# Patient Record
Sex: Male | Born: 1950 | Race: White | Hispanic: No | Marital: Married | Smoking: Never smoker
Health system: Southern US, Community
[De-identification: ages and names within clinical notes are randomized; demographics above are authoritative.]

## PROBLEM LIST (undated history)

## (undated) DIAGNOSIS — I10 Essential (primary) hypertension: Secondary | ICD-10-CM

## (undated) DIAGNOSIS — E119 Type 2 diabetes mellitus without complications: Secondary | ICD-10-CM

## (undated) DIAGNOSIS — E785 Hyperlipidemia, unspecified: Secondary | ICD-10-CM

## (undated) DIAGNOSIS — M6530 Trigger finger, unspecified finger: Secondary | ICD-10-CM

## (undated) DIAGNOSIS — K5090 Crohn's disease, unspecified, without complications: Secondary | ICD-10-CM

## (undated) HISTORY — DX: Essential (primary) hypertension: I10

## (undated) HISTORY — DX: Type 2 diabetes mellitus without complications: E11.9

## (undated) HISTORY — PX: KNEE ARTHROSCOPY: SUR90

---

## 2000-08-16 ENCOUNTER — Encounter: Payer: Self-pay | Admitting: Gastroenterology

## 2000-08-16 ENCOUNTER — Encounter: Admission: RE | Admit: 2000-08-16 | Discharge: 2000-08-16 | Payer: Self-pay | Admitting: Gastroenterology

## 2000-08-29 ENCOUNTER — Encounter (INDEPENDENT_AMBULATORY_CARE_PROVIDER_SITE_OTHER): Payer: Self-pay | Admitting: Specialist

## 2000-08-29 ENCOUNTER — Ambulatory Visit (HOSPITAL_COMMUNITY): Admission: RE | Admit: 2000-08-29 | Discharge: 2000-08-29 | Payer: Self-pay | Admitting: Gastroenterology

## 2004-04-15 ENCOUNTER — Encounter: Admission: RE | Admit: 2004-04-15 | Discharge: 2004-04-15 | Payer: Self-pay | Admitting: Emergency Medicine

## 2004-10-19 ENCOUNTER — Ambulatory Visit (HOSPITAL_COMMUNITY): Admission: RE | Admit: 2004-10-19 | Discharge: 2004-10-19 | Payer: Self-pay | Admitting: Orthopedic Surgery

## 2004-10-19 ENCOUNTER — Ambulatory Visit (HOSPITAL_BASED_OUTPATIENT_CLINIC_OR_DEPARTMENT_OTHER): Admission: RE | Admit: 2004-10-19 | Discharge: 2004-10-19 | Payer: Self-pay | Admitting: Orthopedic Surgery

## 2008-05-21 ENCOUNTER — Ambulatory Visit: Payer: Self-pay | Admitting: Diagnostic Radiology

## 2008-05-21 ENCOUNTER — Emergency Department (HOSPITAL_BASED_OUTPATIENT_CLINIC_OR_DEPARTMENT_OTHER): Admission: EM | Admit: 2008-05-21 | Discharge: 2008-05-21 | Payer: Self-pay | Admitting: Emergency Medicine

## 2009-05-01 HISTORY — PX: TRIGGER FINGER RELEASE: SHX641

## 2010-09-16 NOTE — Op Note (Signed)
NAMEISHMAIL, MCMANAMON                ACCOUNT NO.:  [id number]   MEDICAL RECORD NO.:  [id number]          PATIENT TYPE:  AMB   LOCATION:  DSC                          FACILITY:  [facility]   PHYSICIAN:  Cindee Salt, M.D.       DATE OF BIRTH:  12/12/1950   DATE OF PROCEDURE:  10/19/2004  DATE OF DISCHARGE:                                 OPERATIVE REPORT   PREOPERATIVE DIAGNOSIS:  Stenosing tenosynovitis, right index finger.   POSTOPERATIVE DIAGNOSIS:  Same.   OPERATION:  Release A1 pulley, right index finger.   SURGEON:  Cindee Salt, M.D.   ASSISTANTCarolyne Fiscal.   ANESTHESIA:  MAC.   HISTORY:  The patient is a 60 year old male with a history of triggering of  his right index finger. This has not responded to conservative treatment.   PROCEDURE:  The patient is brought to the operating room where a local  anesthetic was given with sedation after being prepped using DuraPrep,  supine position, right arm free. After adequate anesthesia was afforded, his  limb was exsanguinated. A tourniquet placed on forearm was inflated to 270  mmHg. An oblique incision was made, carried down through subcutaneous  tissue. Neurovascular structures identified and protected. The dissection  was then carried to the radial aspect of the A1 pulley.  Significant  tenosynovitis was present. This was removed. The A1 pulley was incised in  its radial aspect.  A small incision was made in A2 centrally. This allowed  the finger to be placed through full range motion with no further  triggering.  The wound was irrigated. Skin was closed with interrupted 5-0  nylon sutures. Sterile compressive dressing was applied. The patient  tolerated the procedure well and was taken to the recovery observation in  satisfactory condition. He is discharged home to return to the [facility]  [facility] [place] in one week on Percocet.       GK/MEDQ  D:  10/19/2004  T:  10/19/2004  Job:  [id number]

## 2010-09-16 NOTE — Procedures (Signed)
[facility]  Patient:    Shane Snyder, Shane Snyder                       MRN: [id number] Proc. Date: 08/29/00 Adm. Date:  [id number] Attending:  Orland Mustard CC:         Oley Balm. Georgina Pillion, M.D.   Procedure Report  PROCEDURE:  Colonoscopy and biopsy.  MEDICATIONS:  Fentanyl 75 mcg, Versed 10 mg IV.  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  The patient has never had any follow-up.  For this reason, colonoscopy is performed.  DESCRIPTION OF PROCEDURE:  The procedure had been explained to the patient and consent obtained.  With the patient in the left lateral decubitus position, digital exam is performed.  The adult Olympus video colonoscope was inserted and advanced under direct visualization.  The prep was excellent.  We were able to advance around to the cecum without difficulty.  The ileocecal valve was seen.  The terminal ileum was entered and was completely normal.  There was no evidence of Crohns.  The scope was withdrawn.  The cecum, ascending colon, hepatic flexure, and transverse colon were endoscopically normal. Several biopsies were taken.  The mucosa was carefully examined in the descending and sigmoid colon.  There was some granularity and friability of very mild nature.  This was biopsied and placed in jar #2.  The rectum appeared endoscopically normal.  The scope was withdrawn.  The patient tolerated the procedure well.  ASSESSMENT:  Possible mild left-sided colitis, clearly currently in remission.  PLAN:  Will see check pathology and continue on the same medicines for now. DD:  08/29/00 TD:  08/29/00 Job: [id number]

## 2012-08-19 ENCOUNTER — Telehealth: Payer: Self-pay | Admitting: Radiology

## 2012-08-19 ENCOUNTER — Ambulatory Visit (INDEPENDENT_AMBULATORY_CARE_PROVIDER_SITE_OTHER): Payer: BC Managed Care – PPO | Admitting: Family Medicine

## 2012-08-19 ENCOUNTER — Ambulatory Visit
Admission: RE | Admit: 2012-08-19 | Discharge: 2012-08-19 | Disposition: A | Discharge status: Home / Self Care | Payer: BC Managed Care – PPO | Source: Ambulatory Visit | Attending: Physician Assistant | Admitting: Physician Assistant

## 2012-08-19 VITALS — BP 142/87 | HR 63 | Temp 98.1°F | Resp 16 | Ht 69.75 in | Wt 221.0 lb

## 2012-08-19 DIAGNOSIS — L255 Unspecified contact dermatitis due to plants, except food: Secondary | ICD-10-CM

## 2012-08-19 DIAGNOSIS — M25421 Effusion, right elbow: Secondary | ICD-10-CM

## 2012-08-19 DIAGNOSIS — M25429 Effusion, unspecified elbow: Secondary | ICD-10-CM

## 2012-08-19 DIAGNOSIS — L237 Allergic contact dermatitis due to plants, except food: Secondary | ICD-10-CM

## 2012-08-19 MED ORDER — CLOBETASOL PROPIONATE 0.05 % EX CREA: TOPICAL_CREAM | Freq: Two times a day (BID) | CUTANEOUS | Status: DC

## 2012-08-19 NOTE — Telephone Encounter (Signed)
Call report from [facility],no DVT on [place], patient has been d/c home. Shane Snyder

## 2012-08-19 NOTE — Progress Notes (Signed)
  Subjective:    Patient ID: Shane Snyder, male    DOB: May 23, 1950, 62 y.o.   MRN: [id number]  HPI 62 year old male presents for evaluation of swelling of right arm.  Has had poison ivy on both arms and trunk x 1 week that he has been treating with calamine lotion.  States he would not have come in for the poison ivy, but the swelling this a.m alarmed him.  States in less than an hour, his right medial forearm swelled.  Denies pain, weakness, or numbness.  Does admit to some tingling in his fingers. No warmth or pain to palpation.  Poison ivy is on both arms and weeping.    PMHx of HTN and DM - controlled by PCP.     Review of Systems  Constitutional: Negative for fever and chills.  Respiratory: Negative for shortness of breath.   Skin: Positive for rash.  Neurological: Negative for headaches.       Objective:   Physical Exam  Constitutional: He is oriented to person, place, and time. He appears well-developed and well-nourished.  HENT:  Head: Normocephalic and atraumatic.  Right Ear: External ear normal.  Left Ear: External ear normal.  Eyes: Conjunctivae are normal.  Neck: Normal range of motion.  Cardiovascular: Normal rate.   Pulmonary/Chest: Effort normal.  Neurological: He is alert and oriented to person, place, and time.  Skin:     Psychiatric: He has a normal mood and affect. His behavior is normal. Judgment and thought content normal.          Assessment & Plan:  1. Swelling of joint of upper arm, right - Plan: US Venous Img Upper Uni Right  -Sent to [facility] for [place] of right upper extremity   -Rule out thrombus  -Discussed with patient RTC precautions including acute pain in right arm or increase in swelling 2. Poison ivy - Plan: clobetasol cream (TEMOVATE) 0.05 %  -Swelling likely due to poison ivy dermatitis.   -Unable to treat with prednisone due to DM type 2  -Clobetasol twice daily until resolved  -Zyrtec daily in the morning, Benadryl at  bedtime.

## 2012-08-19 NOTE — Progress Notes (Signed)
Examined pt with Shane Moody, PA-C.  He has soft swelling on the ulnar side of the right forearm. Normal cap refill of fingers, strong radial pulse.  Do not suspect compartment syndrome.    Called pt and let him know [place] was negative.  Again went over warning signs such as severe pain in his arm/ hand or numbness- call if any concerns or if not getting better soon.

## 2012-08-20 NOTE — Telephone Encounter (Signed)
Patient has been notified. Continue with treatment as planned and follow up if symptoms worsen or fail to improve.

## 2013-11-18 ENCOUNTER — Ambulatory Visit (INDEPENDENT_AMBULATORY_CARE_PROVIDER_SITE_OTHER): Payer: BLUE CROSS/BLUE SHIELD | Admitting: Family Medicine

## 2013-11-18 ENCOUNTER — Encounter: Payer: Self-pay | Admitting: Family Medicine

## 2013-11-18 VITALS — BP 130/80 | HR 68 | Temp 98.6°F | Resp 16 | Ht 71.0 in | Wt 224.4 lb

## 2013-11-18 DIAGNOSIS — H00039 Abscess of eyelid unspecified eye, unspecified eyelid: Secondary | ICD-10-CM

## 2013-11-18 DIAGNOSIS — H00033 Abscess of eyelid right eye, unspecified eyelid: Secondary | ICD-10-CM

## 2013-11-18 MED ORDER — CEPHALEXIN 500 MG PO CAPS: 500.0000 mg | ORAL_CAPSULE | Freq: Two times a day (BID) | ORAL | Status: DC

## 2013-11-18 MED ORDER — PREDNISONE 20 MG PO TABS: 40.0000 mg | ORAL_TABLET | Freq: Every day | ORAL | Status: DC

## 2013-11-18 NOTE — Patient Instructions (Signed)
Cellulitis Cellulitis is an infection of the skin and the tissue beneath it. The infected area is usually red and tender. Cellulitis occurs most often in the arms and lower legs.  CAUSES  Cellulitis is caused by bacteria that enter the skin through cracks or cuts in the skin. The most common types of bacteria that cause cellulitis are Staphylococcus and Streptococcus. SYMPTOMS   Redness and warmth.  Swelling.  Tenderness or pain.  Fever. DIAGNOSIS  Your caregiver can usually determine what is wrong based on a physical exam. Blood tests may also be done. TREATMENT  Treatment usually involves taking an antibiotic medicine. HOME CARE INSTRUCTIONS   Take your antibiotics as directed. Finish them even if you start to feel better.  Keep the infected arm or leg elevated to reduce swelling.  Apply a warm cloth to the affected area up to 4 times per day to relieve pain.  Only take over-the-counter or prescription medicines for pain, discomfort, or fever as directed by your caregiver.  Keep all follow-up appointments as directed by your caregiver. SEEK MEDICAL CARE IF:   You notice red streaks coming from the infected area.  Your red area gets larger or turns dark in color.  Your bone or joint underneath the infected area becomes painful after the skin has healed.  Your infection returns in the same area or another area.  You notice a swollen bump in the infected area.  You develop new symptoms. SEEK IMMEDIATE MEDICAL CARE IF:   You have a fever.  You feel very sleepy.  You develop vomiting or diarrhea.  You have a general ill feeling (malaise) with muscle aches and pains. MAKE SURE YOU:   Understand these instructions.  Will watch your condition.  Will get help right away if you are not doing well or get worse. Document Released: 01/25/2005 Document Revised: 10/17/2011 Document Reviewed: 07/03/2011 ExitCare Patient Information 2015 [employer]. This information is  not intended to replace advice given to you by your health care provider. Make sure you discuss any questions you have with your health care provider.  

## 2013-11-18 NOTE — Progress Notes (Signed)
This chart was scribed for Shane Snyder by [employer], Scribe. This patient was seen in room 10 and the patient's care was started at 8:19 AM.  @UMFCLOGO @  Patient ID: Shane Snyder MRN: [id number], DOB: 08-Jul-1950, 63 y.o. Date of Encounter: 11/18/2013, 8:25 AM  Primary Physician: No PCP Per Patient  Chief Complaint: Eye Issue  HPI: 63 y.o. year old male with history below presents with right eye swelling and redness noticed when he awoke from sleep this morning. He states that he noticed his eye to be slightly puffy last night, but that he was otherwise normal. He states that he has mild discomfort to the area, but he denies any burning, itching or pain to the eye. He denies any injury to his eye. He states that he has no medication allergies. He states that he works at [employer]. He has a history of HTN and DM which he states is well-controlled.   Past Medical History  Diagnosis Date  . Diabetes mellitus without complication   . Hypertension      Home Meds: Prior to Admission medications   Medication Sig Start Date End Date Taking? Authorizing Provider  clobetasol cream (TEMOVATE) 0.05 % Apply topically 2 (two) times daily. 08/19/12   Heather Elnora Morrison, PA-C  METFORMIN HCL PO Take 1 tablet by mouth daily.    Historical Provider, MD    Allergies: No Known Allergies  History   Social History  . Marital Status: Married    Spouse Name: N/A    Number of Children: N/A  . Years of Education: N/A   Occupational History  . Not on file.   Social History Main Topics  . Smoking status: Never Smoker   . Smokeless tobacco: Not on file  . Alcohol Use: Not on file  . Drug Use: No  . Sexual Activity: Yes   Other Topics Concern  . Not on file   Social History Narrative  . No narrative on file      Review of Systems: Constitutional: negative for chills, fever, night sweats, weight changes, or fatigue  HEENT: negative for vision changes, hearing loss, congestion,  rhinorrhea, ST, epistaxis, or sinus pressure Cardiovascular: negative for chest pain or palpitations Respiratory: negative for hemoptysis, wheezing, shortness of breath, or cough Abdominal: negative for abdominal pain, nausea, vomiting, diarrhea, or constipation Dermatological: negative for rash Neurologic: negative for headache, dizziness, or syncope All other systems reviewed and are otherwise negative with the exception to those above and in the HPI.   Physical Exam: Blood pressure 130/80, pulse 68, temperature 98.6 F (37 C), temperature source Oral, resp. rate 16, height 5\' 11"  (1.803 m), weight 224 lb 6.4 oz (101.787 kg), SpO2 96.00%., Body mass index is 31.31 kg/(m^2). General: Well developed, well nourished, in no acute distress. Head: Normocephalic, atraumatic, eyes without discharge, sclera non-icteric, Right upper eyelid is moderately swollen. PERRLA. EOM intact. nares are without discharge. Bilateral auditory canals clear, TM's are without perforation, pearly grey and translucent with reflective cone of light bilaterally. Oral cavity moist, posterior pharynx without exudate, erythema, peritonsillar abscess, or post nasal drip.  Neck: Supple. No thyromegaly. Full ROM. No lymphadenopathy. Lungs: Clear bilaterally to auscultation without wheezes, rales, or rhonchi. Breathing is unlabored. Heart: RRR with S1 S2. No murmurs, rubs, or gallops appreciated. Abdomen: Soft, non-tender, non-distended with normoactive bowel sounds. No hepatomegaly. No rebound/guarding. No obvious abdominal masses. Msk:  Strength and tone normal for age. Extremities/Skin: Warm and dry. No clubbing or cyanosis. No edema. No  rashes or suspicious lesions. Neuro: Alert and oriented X 3. Moves all extremities spontaneously. Gait is normal. CNII-XII grossly in tact. Psych:  Responds to questions appropriately with a normal affect.     ASSESSMENT AND PLAN:  63 y.o. year old male with Eyelid cellulitis, right -  Plan: predniSONE (DELTASONE) 20 MG tablet, cephALEXin (KEFLEX) 500 MG capsule   Signed, Shane Haber, MD 11/18/2013 8:25 AM

## 2017-10-31 ENCOUNTER — Other Ambulatory Visit: Payer: Self-pay | Admitting: Family Medicine

## 2017-10-31 DIAGNOSIS — N23 Unspecified renal colic: Secondary | ICD-10-CM

## 2017-11-12 ENCOUNTER — Ambulatory Visit
Admission: RE | Admit: 2017-11-12 | Discharge: 2017-11-12 | Disposition: A | Discharge status: Home / Self Care | Payer: Medicare Other | Source: Ambulatory Visit | Attending: Family Medicine | Admitting: Family Medicine

## 2017-11-12 DIAGNOSIS — N23 Unspecified renal colic: Secondary | ICD-10-CM

## 2017-11-16 ENCOUNTER — Other Ambulatory Visit: Payer: Self-pay | Admitting: Family Medicine

## 2017-11-16 DIAGNOSIS — K7689 Other specified diseases of liver: Secondary | ICD-10-CM

## 2017-12-04 ENCOUNTER — Ambulatory Visit
Admission: RE | Admit: 2017-12-04 | Discharge: 2017-12-04 | Disposition: A | Discharge status: Home / Self Care | Payer: Medicare Other | Source: Ambulatory Visit | Attending: Family Medicine | Admitting: Family Medicine

## 2017-12-04 DIAGNOSIS — K7689 Other specified diseases of liver: Secondary | ICD-10-CM

## 2018-06-28 ENCOUNTER — Other Ambulatory Visit: Payer: Self-pay | Admitting: Family Medicine

## 2018-06-28 DIAGNOSIS — K7689 Other specified diseases of liver: Secondary | ICD-10-CM

## 2018-07-10 ENCOUNTER — Ambulatory Visit
Admission: RE | Admit: 2018-07-10 | Discharge: 2018-07-10 | Disposition: A | Discharge status: Home / Self Care | Payer: Medicare Other | Source: Ambulatory Visit | Attending: Family Medicine | Admitting: Family Medicine

## 2018-07-10 DIAGNOSIS — K7689 Other specified diseases of liver: Secondary | ICD-10-CM

## 2018-10-05 IMAGING — US US ABDOMEN LIMITED
1 series · 14 of 25 positions shown · non-contrast
Comparison: 11/12/2017 CT

CLINICAL DATA: Follow-up liver cysts. Recent CT of the abdomen and
pelvis

EXAM:
ULTRASOUND ABDOMEN LIMITED RIGHT UPPER QUADRANT

[Series 1: us abdomen limited · 0.27mm/px · 69 acquisitions, 14 frames shown]
[im 1/69]
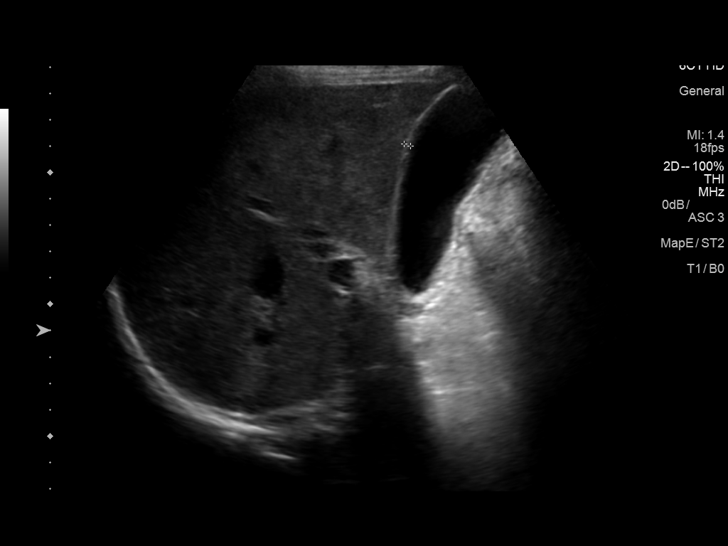
[im 6/69]
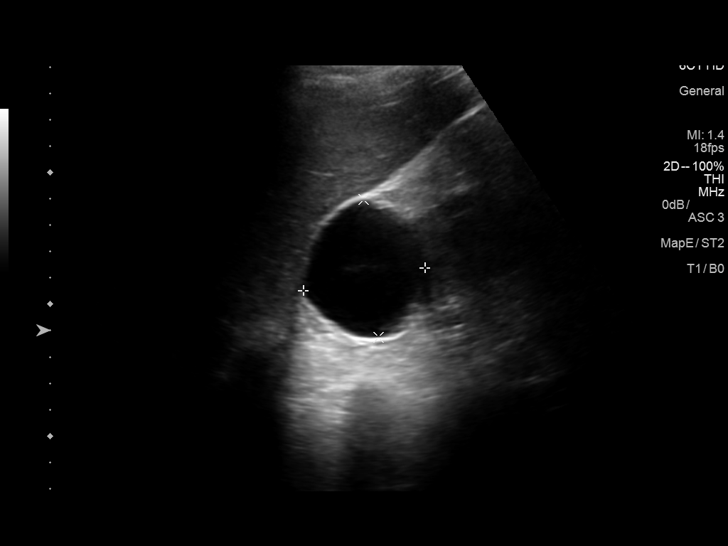
[im 12/69]
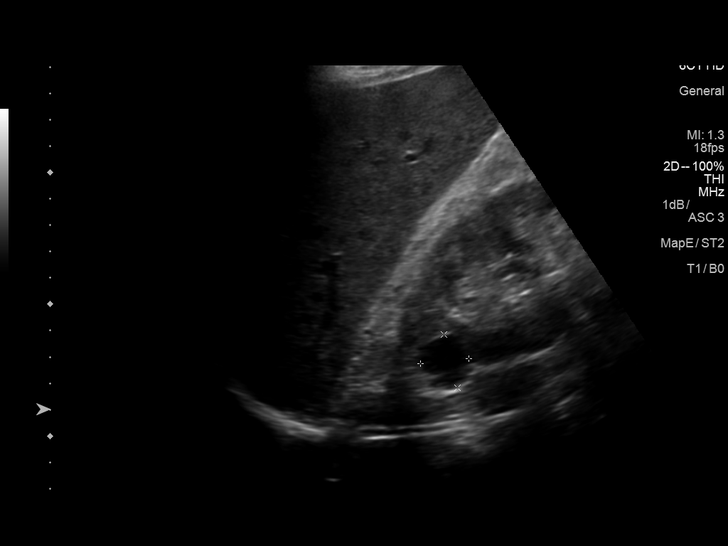
[im 18/69]
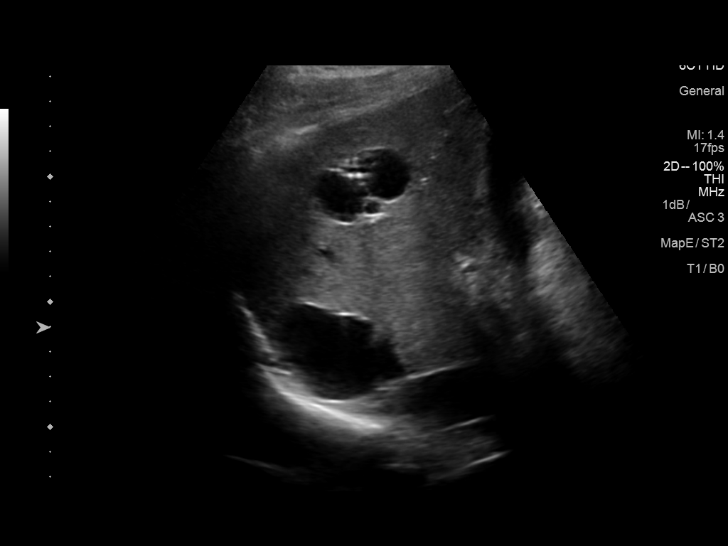
[im 23/69]
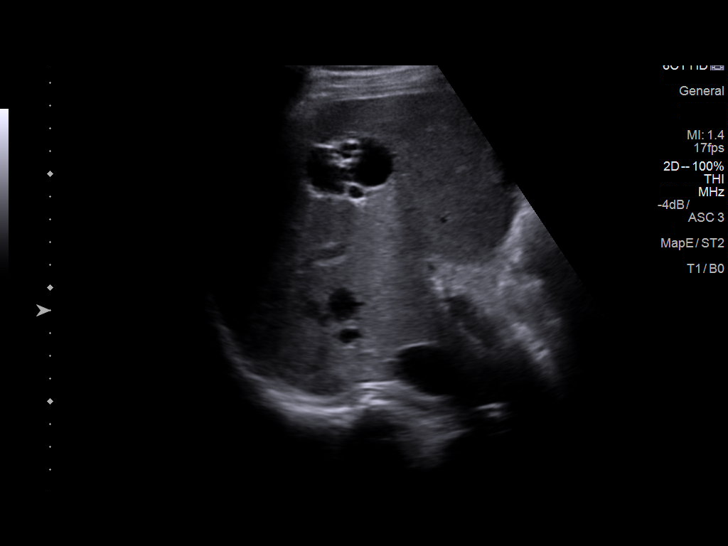
[im 26/69]
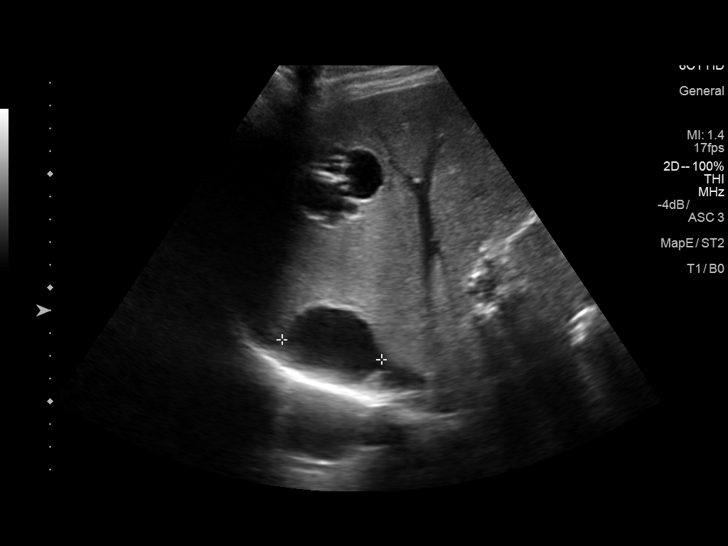
[im 32/69]
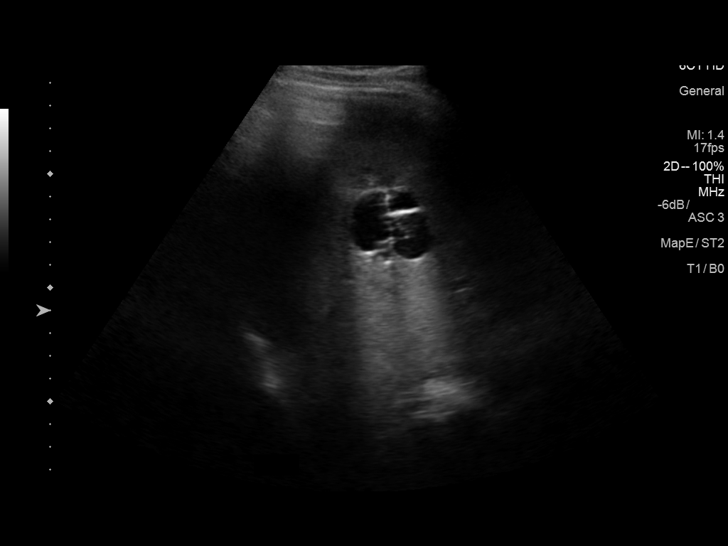
[im 37/69]
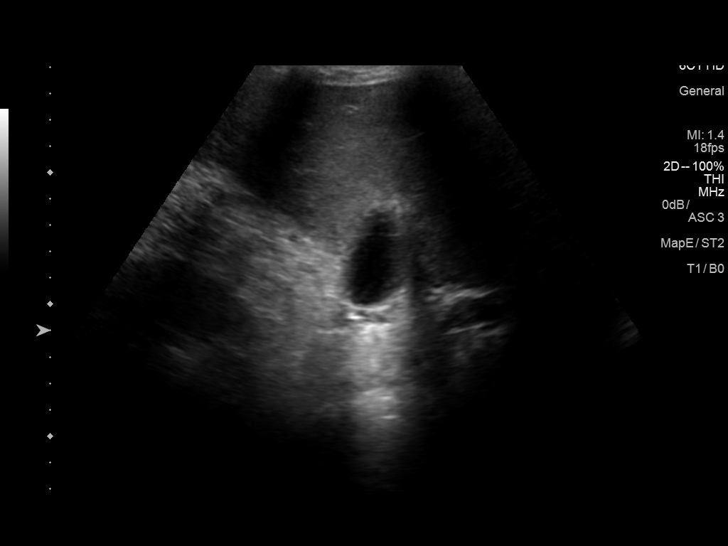
[im 43/69]
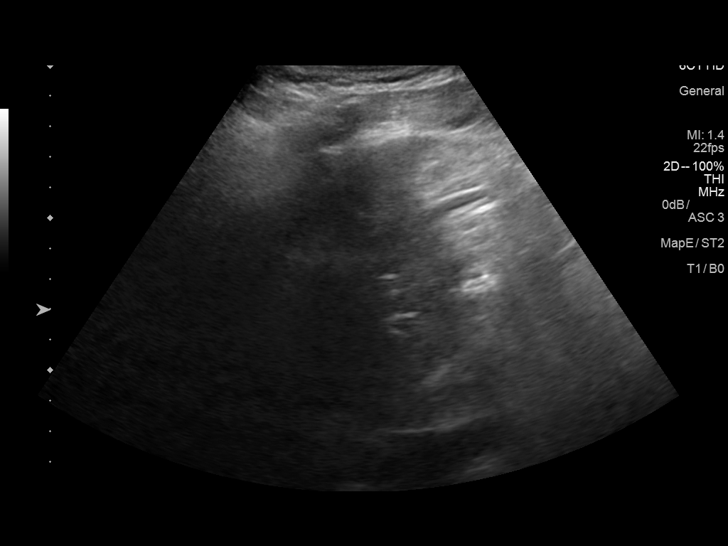
[im 46/69]
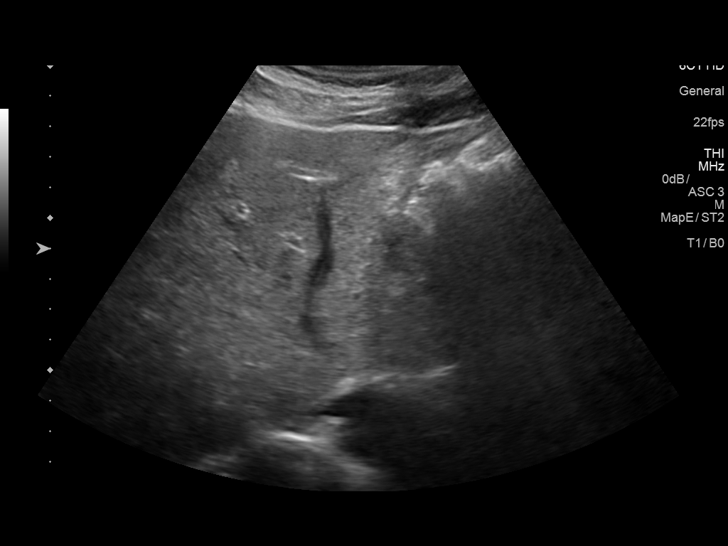
[im 52/69]
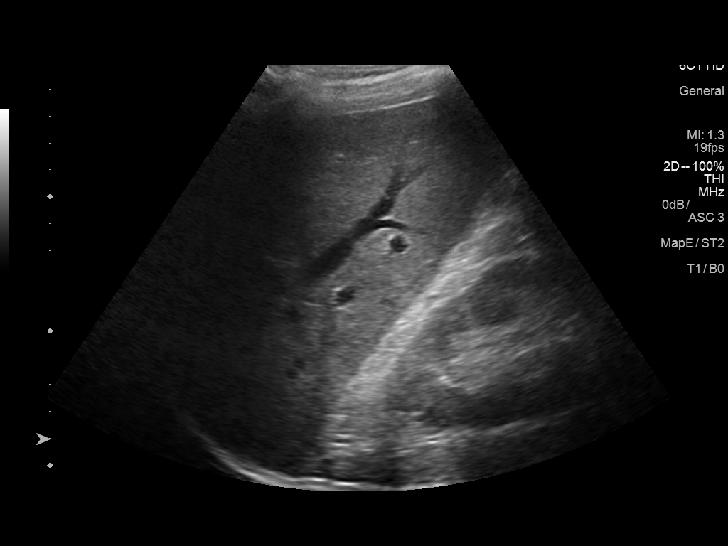
[im 57/69]
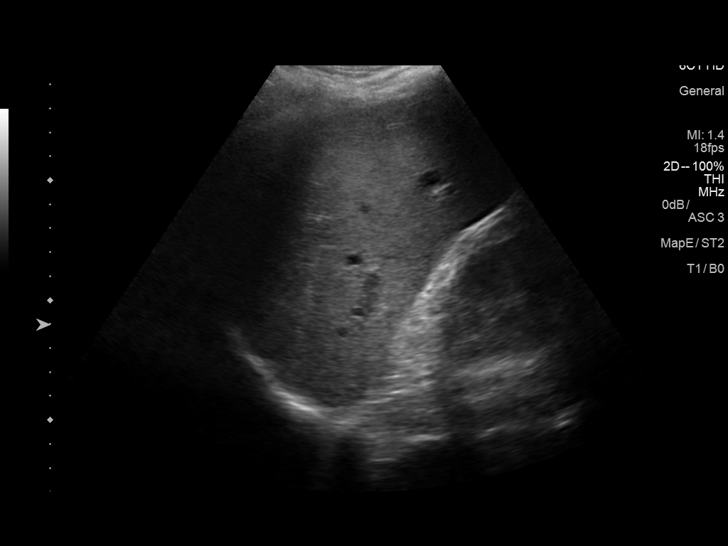
[im 63/69]
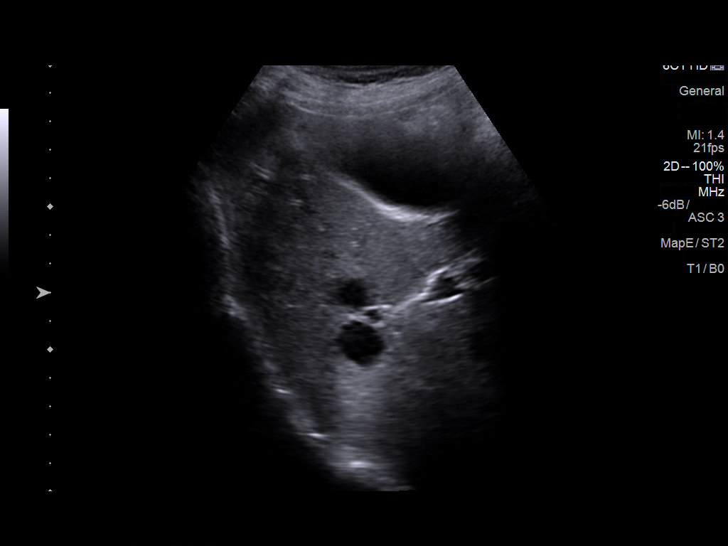
[im 69/69]
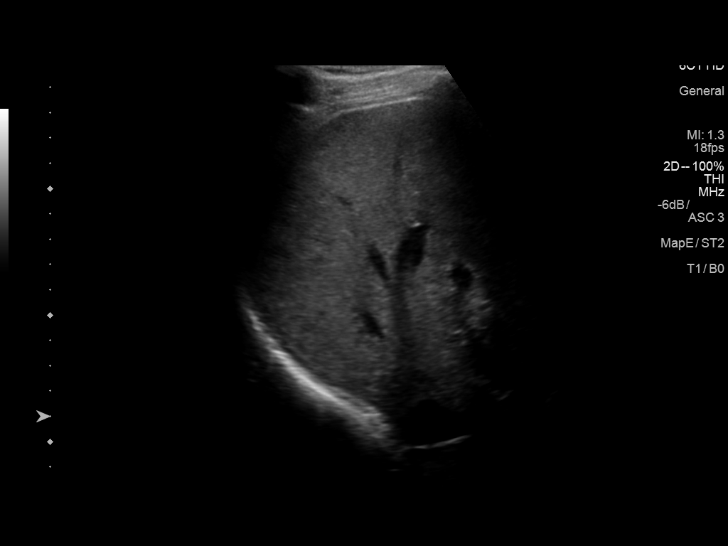

[14 of 25 positions shown; findings below may reference images not displayed]

FINDINGS: Gallbladder:

Gallbladder has a normal appearance. Gallbladder wall is
millimeters, within normal limits. No stones or pericholecystic
fluid. No sonographic Murphy's sign.

Common bile duct:

Diameter: 2.6 millimeters

Liver:

Liver echotexture is normal. Within the RIGHT hepatic lobe, adjacent
cysts are 3.9 x 2.1 x 4.0 centimeters measured together. There is no
discrete solid component or internal blood flow. A posterior RIGHT
hepatic cyst is 3.9 x 3.5 x 4.5 centimeters. There is probable thin
internal septation associated with this cyst. Portal vein is patent
on color Doppler imaging with normal direction of blood flow towards
the liver.

Additional:

RIGHT renal cysts are present.
IMPRESSION: 1. No evidence for acute cholecystitis.
2. RIGHT hepatic lobe cluster of cysts, likely benign. Recommend
followup abdominal ultrasound in 6-12 months.
3. Numerous renal cysts.

## 2020-05-24 DIAGNOSIS — M19042 Primary osteoarthritis, left hand: Secondary | ICD-10-CM | POA: Diagnosis not present

## 2020-05-24 DIAGNOSIS — M79642 Pain in left hand: Secondary | ICD-10-CM | POA: Diagnosis not present

## 2020-05-24 DIAGNOSIS — M65322 Trigger finger, left index finger: Secondary | ICD-10-CM | POA: Diagnosis not present

## 2020-05-25 ENCOUNTER — Other Ambulatory Visit: Payer: Self-pay | Admitting: Orthopedic Surgery

## 2020-05-28 ENCOUNTER — Other Ambulatory Visit: Payer: Self-pay

## 2020-05-28 ENCOUNTER — Encounter (HOSPITAL_BASED_OUTPATIENT_CLINIC_OR_DEPARTMENT_OTHER): Payer: Self-pay | Admitting: Orthopedic Surgery

## 2020-06-01 ENCOUNTER — Encounter (HOSPITAL_BASED_OUTPATIENT_CLINIC_OR_DEPARTMENT_OTHER)
Admission: RE | Admit: 2020-06-01 | Discharge: 2020-06-01 | Disposition: A | Discharge status: Home / Self Care | Payer: Medicare Other | Source: Ambulatory Visit | Attending: Orthopedic Surgery | Admitting: Orthopedic Surgery

## 2020-06-01 ENCOUNTER — Other Ambulatory Visit (HOSPITAL_COMMUNITY)
Admission: RE | Admit: 2020-06-01 | Discharge: 2020-06-01 | Disposition: A | Discharge status: Home / Self Care | Payer: Medicare Other | Source: Ambulatory Visit | Attending: Orthopedic Surgery | Admitting: Orthopedic Surgery

## 2020-06-01 DIAGNOSIS — Z01818 Encounter for other preprocedural examination: Secondary | ICD-10-CM | POA: Insufficient documentation

## 2020-06-01 DIAGNOSIS — Z01812 Encounter for preprocedural laboratory examination: Secondary | ICD-10-CM | POA: Insufficient documentation

## 2020-06-01 DIAGNOSIS — Z20822 Contact with and (suspected) exposure to covid-19: Secondary | ICD-10-CM | POA: Insufficient documentation

## 2020-06-01 LAB — BASIC METABOLIC PANEL
Anion gap: 12 (ref 5–15)
BUN: 16 mg/dL (ref 8–23)
CO2: 27 mmol/L (ref 22–32)
Calcium: 9.7 mg/dL (ref 8.9–10.3)
Chloride: 101 mmol/L (ref 98–111)
Creatinine, Ser: 0.98 mg/dL (ref 0.61–1.24)
GFR, Estimated: >60 mL/min (ref >60)
Glucose, Bld: 141 mg/dL — ABNORMAL HIGH (ref 70–99)
Potassium: 4.4 mmol/L (ref 3.5–5.1)
Sodium: 140 mmol/L (ref 135–145)

## 2020-06-01 LAB — SARS CORONAVIRUS 2 (TAT 6-24 HRS): SARS Coronavirus 2: NEGATIVE

## 2020-06-01 NOTE — Progress Notes (Signed)

## 2020-06-04 ENCOUNTER — Encounter (HOSPITAL_BASED_OUTPATIENT_CLINIC_OR_DEPARTMENT_OTHER): Payer: Self-pay | Admitting: Orthopedic Surgery

## 2020-06-04 ENCOUNTER — Ambulatory Visit (HOSPITAL_BASED_OUTPATIENT_CLINIC_OR_DEPARTMENT_OTHER)
Admission: RE | Admit: 2020-06-04 | Discharge: 2020-06-04 | Disposition: A | Discharge status: Home / Self Care | Payer: Medicare Other | Attending: Orthopedic Surgery | Admitting: Orthopedic Surgery

## 2020-06-04 ENCOUNTER — Other Ambulatory Visit: Payer: Self-pay

## 2020-06-04 ENCOUNTER — Ambulatory Visit (HOSPITAL_BASED_OUTPATIENT_CLINIC_OR_DEPARTMENT_OTHER): Payer: Medicare Other | Admitting: Anesthesiology

## 2020-06-04 ENCOUNTER — Encounter (HOSPITAL_BASED_OUTPATIENT_CLINIC_OR_DEPARTMENT_OTHER)
Admission: RE | Disposition: A | Discharge status: Home / Self Care | Payer: Self-pay | Source: Home / Self Care | Attending: Orthopedic Surgery

## 2020-06-04 DIAGNOSIS — E119 Type 2 diabetes mellitus without complications: Secondary | ICD-10-CM | POA: Diagnosis not present

## 2020-06-04 DIAGNOSIS — I10 Essential (primary) hypertension: Secondary | ICD-10-CM | POA: Diagnosis not present

## 2020-06-04 DIAGNOSIS — Z79899 Other long term (current) drug therapy: Secondary | ICD-10-CM | POA: Diagnosis not present

## 2020-06-04 DIAGNOSIS — Z888 Allergy status to other drugs, medicaments and biological substances status: Secondary | ICD-10-CM | POA: Insufficient documentation

## 2020-06-04 DIAGNOSIS — M65322 Trigger finger, left index finger: Secondary | ICD-10-CM | POA: Diagnosis not present

## 2020-06-04 DIAGNOSIS — E785 Hyperlipidemia, unspecified: Secondary | ICD-10-CM | POA: Diagnosis not present

## 2020-06-04 DIAGNOSIS — M65842 Other synovitis and tenosynovitis, left hand: Secondary | ICD-10-CM | POA: Diagnosis not present

## 2020-06-04 HISTORY — DX: Crohn's disease, unspecified, without complications: K50.90

## 2020-06-04 HISTORY — DX: Trigger finger, unspecified finger: M65.30

## 2020-06-04 HISTORY — PX: TRIGGER FINGER RELEASE: SHX641

## 2020-06-04 HISTORY — DX: Hyperlipidemia, unspecified: E78.5

## 2020-06-04 SURGERY — RELEASE, A1 PULLEY, FOR TRIGGER FINGER
Anesthesia: Regional | Site: Hand | Laterality: Left

## 2020-06-04 MED ORDER — PROMETHAZINE HCL 25 MG/ML IJ SOLN: 6.2500 mg | INTRAMUSCULAR | Status: DC | PRN

## 2020-06-04 MED ORDER — MEPERIDINE HCL 25 MG/ML IJ SOLN: 6.2500 mg | INTRAMUSCULAR | Status: DC | PRN

## 2020-06-04 MED ORDER — FENTANYL CITRATE (PF) 100 MCG/2ML IJ SOLN
INTRAMUSCULAR | Status: AC
  Filled 2020-06-04: qty 2

## 2020-06-04 MED ORDER — MIDAZOLAM HCL 2 MG/2ML IJ SOLN
INTRAMUSCULAR | Status: AC
  Filled 2020-06-04: qty 2

## 2020-06-04 MED ORDER — LACTATED RINGERS IV SOLN: INTRAVENOUS | Status: DC

## 2020-06-04 MED ORDER — ONDANSETRON HCL 4 MG/2ML IJ SOLN
INTRAMUSCULAR | Status: DC | PRN
  Administered 2020-06-04: 4 mg via INTRAVENOUS

## 2020-06-04 MED ORDER — CEFAZOLIN SODIUM-DEXTROSE 2-4 GM/100ML-% IV SOLN
INTRAVENOUS | Status: AC
  Filled 2020-06-04: qty 100

## 2020-06-04 MED ORDER — LIDOCAINE HCL (PF) 0.5 % IJ SOLN
INTRAMUSCULAR | Status: AC
  Filled 2020-06-04: qty 50

## 2020-06-04 MED ORDER — ONDANSETRON HCL 4 MG/2ML IJ SOLN
INTRAMUSCULAR | Status: AC
  Filled 2020-06-04: qty 2

## 2020-06-04 MED ORDER — PROPOFOL 500 MG/50ML IV EMUL
INTRAVENOUS | Status: AC
  Filled 2020-06-04: qty 50

## 2020-06-04 MED ORDER — CEFAZOLIN SODIUM-DEXTROSE 2-4 GM/100ML-% IV SOLN
2.0000 g | INTRAVENOUS | Status: AC
  Administered 2020-06-04: 2 g via INTRAVENOUS

## 2020-06-04 MED ORDER — FENTANYL CITRATE (PF) 100 MCG/2ML IJ SOLN
25.0000 ug | INTRAMUSCULAR | Status: DC | PRN
Start: 2020-06-04 — End: 2020-06-04

## 2020-06-04 MED ORDER — BUPIVACAINE HCL (PF) 0.25 % IJ SOLN
INTRAMUSCULAR | Status: DC | PRN
  Administered 2020-06-04: 5 mL

## 2020-06-04 MED ORDER — TRAMADOL HCL 50 MG PO TABS
50.0000 mg | ORAL_TABLET | Freq: Four times a day (QID) | ORAL | 0 refills | Status: AC | PRN
Start: 2020-06-04 — End: ?

## 2020-06-04 MED ORDER — FENTANYL CITRATE (PF) 100 MCG/2ML IJ SOLN
INTRAMUSCULAR | Status: DC | PRN
  Administered 2020-06-04 (×2): 50 ug via INTRAVENOUS

## 2020-06-04 MED ORDER — PROPOFOL 10 MG/ML IV BOLUS
INTRAVENOUS | Status: DC | PRN
  Administered 2020-06-04: 20 mg via INTRAVENOUS

## 2020-06-04 MED ORDER — MIDAZOLAM HCL 2 MG/2ML IJ SOLN
INTRAMUSCULAR | Status: DC | PRN
  Administered 2020-06-04: 2 mg via INTRAVENOUS

## 2020-06-04 MED ORDER — EPHEDRINE SULFATE 50 MG/ML IJ SOLN
INTRAMUSCULAR | Status: DC | PRN
  Administered 2020-06-04: 10 mg via INTRAVENOUS

## 2020-06-04 MED ORDER — PROPOFOL 500 MG/50ML IV EMUL
INTRAVENOUS | Status: DC | PRN
  Administered 2020-06-04: 100 ug/kg/min via INTRAVENOUS

## 2020-06-04 SURGICAL SUPPLY — 31 items
BLADE SURG 15 STRL LF DISP TIS (BLADE) ×1 IMPLANT
BLADE SURG 15 STRL SS (BLADE) ×2
BNDG COHESIVE 2X5 TAN STRL LF (GAUZE/BANDAGES/DRESSINGS) ×2 IMPLANT
BNDG ESMARK 4X9 LF (GAUZE/BANDAGES/DRESSINGS) ×2 IMPLANT
CHLORAPREP W/TINT 26 (MISCELLANEOUS) ×2 IMPLANT
CORD BIPOLAR FORCEPS 12FT (ELECTRODE) ×2 IMPLANT
COVER BACK TABLE 60X90IN (DRAPES) ×2 IMPLANT
COVER MAYO STAND STRL (DRAPES) ×2 IMPLANT
COVER WAND RF STERILE (DRAPES) IMPLANT
CUFF TOURN SGL QUICK 18X4 (TOURNIQUET CUFF) ×2 IMPLANT
DECANTER SPIKE VIAL GLASS SM (MISCELLANEOUS) ×2 IMPLANT
DRAPE EXTREMITY T 121X128X90 (DISPOSABLE) ×2 IMPLANT
DRAPE SURG 17X23 STRL (DRAPES) ×2 IMPLANT
GAUZE SPONGE 4X4 12PLY STRL (GAUZE/BANDAGES/DRESSINGS) ×2 IMPLANT
GAUZE XEROFORM 1X8 LF (GAUZE/BANDAGES/DRESSINGS) ×2 IMPLANT
GLOVE BIOGEL PI IND STRL 8.5 (GLOVE) ×1 IMPLANT
GLOVE BIOGEL PI INDICATOR 8.5 (GLOVE) ×1
GLOVE SURG ORTHO LTX SZ8 (GLOVE) ×2 IMPLANT
GOWN STRL REUS W/ TWL LRG LVL3 (GOWN DISPOSABLE) IMPLANT
GOWN STRL REUS W/TWL 2XL LVL3 (GOWN DISPOSABLE) ×2 IMPLANT
GOWN STRL REUS W/TWL LRG LVL3 (GOWN DISPOSABLE)
GOWN STRL REUS W/TWL XL LVL3 (GOWN DISPOSABLE) ×2 IMPLANT
NEEDLE PRECISIONGLIDE 27X1.5 (NEEDLE) ×2 IMPLANT
NS IRRIG 1000ML POUR BTL (IV SOLUTION) ×2 IMPLANT
PACK BASIN DAY SURGERY FS (CUSTOM PROCEDURE TRAY) ×2 IMPLANT
STOCKINETTE 4X48 STRL (DRAPES) ×2 IMPLANT
SUT ETHILON 4 0 PS 2 18 (SUTURE) ×2 IMPLANT
SYR BULB EAR ULCER 3OZ GRN STR (SYRINGE) ×2 IMPLANT
SYR CONTROL 10ML LL (SYRINGE) ×2 IMPLANT
TOWEL GREEN STERILE FF (TOWEL DISPOSABLE) ×4 IMPLANT
UNDERPAD 30X36 HEAVY ABSORB (UNDERPADS AND DIAPERS) ×2 IMPLANT

## 2020-06-04 NOTE — H&P (Signed)
Catching left index finger Shane Snyder is an 70 y.o. male.   Chief Complaint: catching left index finger HPI: Shane Snyder returns the office today with a new problem. He has not been seen in probably 10 years. He is complaining of his left index finger. He has had trigger fingers in the past.. Is probably been greater than 10 years for a release of his right index finger trigger. He states his left index finger began trigger approximately 3 months ago. He recalls no history of injury. He has uses a headset hand to straighten it out. It is worse in the morning. He has tried taking aspirin. He has a history of diabetes no history of thyroid problems arthritis or gout. Family history is positive for diabetes and gout. He has not tried anything else for this    Past Medical History:  Diagnosis Date  . Crohn's disease ([place])   . Diabetes mellitus without complication ([place])   . Hyperlipidemia   . Hypertension   . Trigger finger of left hand    index    Past Surgical History:  Procedure Laterality Date  . KNEE ARTHROSCOPY Left   . TRIGGER FINGER RELEASE Right 2011    History reviewed. No pertinent family history. Social History:  reports that he has never smoked. He has never used smokeless tobacco. He reports current alcohol use of about 14.0 standard drinks of alcohol per week. He reports that he does not use drugs.  Allergies:  Allergies  Allergen Reactions  . Lisinopril     No medications prior to admission.    No results found for this or any previous visit (from the past 48 hour(s)).  No results found.   Pertinent items are noted in HPI.  Height 5\' 10"  (1.778 m), weight 97.5 kg.  General appearance: alert, cooperative and appears stated age Head: Normocephalic, without obvious abnormality Neck: no JVD Resp: clear to auscultation bilaterally Cardio: regular rate and rhythm, S1, S2 normal, no murmur, click, rub or gallop GI: soft, non-tender; bowel sounds normal; no masses,   no organomegaly Extremities:  catching left index finger Pulses: 2+ and symmetric Skin: Skin color, texture, turgor normal. No rashes or lesions Neurologic: Grossly normal Incision/Wound: na  Assessment/Plan Diagnosed stenosing tenosynovitis left index Plan: We have discussed possibility of injection with him. He states it did not work on his right side he would prefer to just proceed with surgical intervention. Prepare postoperative course been discussed along with risks and complications. He is aware that there is no guarantee to the surgery the possibility of infection recurrence injury to arteries nerves tendons incomplete relief symptoms dystrophy. He is scheduled for release A1 pulley left index finger as an outpatient under regional anesthesia.      Daryll Brod 06/04/2020, 5:45 AM

## 2020-06-04 NOTE — Op Note (Signed)
NAME: Shane Snyder MEDICAL RECORD NO: [id number] DATE OF BIRTH: [birth year] FAC[facility] LOCATION: [facility] PHYSICIAN: Wynonia Sours, MD   OPERATIVE REPORT   DATE OF PROCEDURE: 06/04/20    PREOPERATIVE DIAGNOSIS:   Stenosing tenosynovitis left index finger   POSTOPERATIVE DIAGNOSIS:   Same   PROCEDURE:   Release A1 pulley left index finger   SURGEON: Daryll Brod, M.D.   ASSISTANT: none   ANESTHESIA:  Bier block with sedation and Local   INTRAVENOUS FLUIDS:  Per anesthesia flow sheet.   ESTIMATED BLOOD LOSS:  Minimal.   COMPLICATIONS:  None.   SPECIMENS:  none   TOURNIQUET TIME:    Total Tourniquet Time Documented: Forearm (Left) - 25 minutes Total: Forearm (Left) - 25 minutes    DISPOSITION:  Stable to PACU.   INDICATIONS: Patient is a 70 year old male with a history of triggering of his left index finger.  He has had multiple trigger digits in the past that has elected to surgically simply have this surgically released rather than attempting conservative treatment for which has not worked in the past.  Preperi-and postoperative course been discussed along with risks and complications.  He is aware there is no guarantee to the surgery the possibility of infection recurrence injury to arteries nerves tendons incomplete relief of symptoms and dystrophy.  In the preoperative area the patient is seen the extremity marked by both patient and surgeon antibiotic given  OPERATIVE COURSE: Patient is brought to the operating room where a forearm-based IV regional anesthetic was carried out without difficulty under the direction of the anesthesia department after placing him in the supine position with the left arm free.  Prep was done with ChloraPrep and a 3-minute dry time allowed and timeout taken to confirm patient procedure.  A an oblique incision was made he had some feeling in a local infiltration with quarter percent bupivacaine without epinephrine  approximately a 5 cc to 6 cc was used.  The incision was then carried down into the subcutaneous tissue.  Tissue was spread protecting neurovascular bundles radially and ulnarly.  Retractors were placed allowing visualization of the A1 pulley which was markedly thickened.  This was released on its radial aspect a small incision made centrally and A2.  Tenosynovial tissue proximally was separated with blunt dissection and the 2 tendons were then placing retractors and separated breaking any adhesions between the 2 tendons.  The finger was placed through full passive range of motion no further triggering was noted.  The wound was copious irrigated with saline.  The skin was closed erupted 4-0 nylon sutures.  A sterile compressive dressing with the fingers 3 was applied.  Deflation of the tourniquet all fingers immediately pink.  He was taken to the recovery room for observation in satisfactory condition.  He will be discharged home to return to the hand center [facility] in 1 week Tylenol ibuprofen for pain with Ultram for breakthrough.   Daryll Brod, MD Electronically signed, 06/04/20

## 2020-06-04 NOTE — Discharge Instructions (Addendum)

## 2020-06-04 NOTE — Anesthesia Preprocedure Evaluation (Addendum)
Anesthesia Evaluation  Patient identified by MRN, date of birth, ID band Patient awake    Reviewed: Allergy & Precautions, NPO status , Patient's Chart, lab work & pertinent test results, reviewed documented beta blocker date and time , Unable to perform ROS - Chart review only  Airway Mallampati: II  TM Distance: >3 FB Neck ROM: Full    Dental no notable dental hx. (+) Dental Advisory Given, Teeth Intact   Pulmonary neg pulmonary ROS,    Pulmonary exam normal breath sounds clear to auscultation       Cardiovascular hypertension, Pt. on home beta blockers and Pt. on medications Normal cardiovascular exam Rhythm:Regular Rate:Normal     Neuro/Psych negative neurological ROS     GI/Hepatic negative GI ROS, Neg liver ROS,   Endo/Other  diabetes  Renal/GU negative Renal ROS     Musculoskeletal negative musculoskeletal ROS (+)   Abdominal   Peds  Hematology negative hematology ROS (+)   Anesthesia Other Findings   Reproductive/Obstetrics                            Anesthesia Physical Anesthesia Plan  ASA: II  Anesthesia Plan: Bier Block and Bier Block-LIDOCAINE ONLY   Post-op Pain Management:    Induction: Intravenous  PONV Risk Score and Plan: 1 and Treatment may vary due to age or medical condition, Propofol infusion and Ondansetron  Airway Management Planned: Natural Airway  Additional Equipment: None  Intra-op Plan:   Post-operative Plan:   Informed Consent: I have reviewed the patients History and Physical, chart, labs and discussed the procedure including the risks, benefits and alternatives for the proposed anesthesia with the patient or authorized representative who has indicated his/her understanding and acceptance.     Dental advisory given  Plan Discussed with: CRNA  Anesthesia Plan Comments:        Anesthesia Quick Evaluation

## 2020-06-04 NOTE — Anesthesia Postprocedure Evaluation (Signed)
Anesthesia Post Note  Patient: Shane Snyder  Procedure(s) Performed: RELEASE TRIGGER FINGER/A-1 PULLEY LEFT INDEX FINGER (Left Hand)     Patient location during evaluation: PACU Anesthesia Type: Bier Block Level of consciousness: awake and alert Pain management: pain level controlled Vital Signs Assessment: post-procedure vital signs reviewed and stable Respiratory status: spontaneous breathing Cardiovascular status: stable Anesthetic complications: no   No complications documented.  Last Vitals:  Vitals:   06/04/20 1400 06/04/20 1408  BP: 105/72 108/73  Pulse: (!) 57 (!) 57  Resp: 10   Temp:    SpO2: 96% 95%    Last Pain:  Vitals:   06/04/20 1408  TempSrc:   PainSc: 0-No pain                 Nolon Nations

## 2020-06-04 NOTE — Anesthesia Procedure Notes (Signed)
Anesthesia Regional Block: Bier block (IV Regional)   Pre-Anesthetic Checklist: ,, timeout performed, Correct Patient, Correct Site, Correct Laterality, Correct Procedure, Correct Position, site marked, Risks and benefits discussed, Surgical consent,  Pre-op evaluation,  At surgeon's request  Laterality: Right and Left  Prep: alcohol swabs        Procedures:,,,,, intact distal pulses, Esmarch exsanguination, single tourniquet utilized,  Narrative:   Performed by: [employer]

## 2020-06-04 NOTE — Transfer of Care (Signed)
Immediate Anesthesia Transfer of Care Note  Patient: Shane Snyder  Procedure(s) Performed: RELEASE TRIGGER FINGER/A-1 PULLEY LEFT INDEX FINGER (Left Hand)  Patient Location: PACU  Anesthesia Type:Bier block  Level of Consciousness: awake, alert  and oriented  Airway & Oxygen Therapy: Patient Spontanous Breathing and Patient connected to face mask oxygen  Post-op Assessment: Report given to RN and Post -op Vital signs reviewed and stable  Post vital signs: Reviewed and stable  Last Vitals:  Vitals Value Taken Time  BP 104/71 06/04/20 1341  Temp    Pulse 71 06/04/20 1342  Resp 13 06/04/20 1342  SpO2 99 % 06/04/20 1342  Vitals shown include unvalidated device data.  Last Pain:  Vitals:   06/04/20 1150  TempSrc: Oral  PainSc: 0-No pain         Complications: No complications documented.

## 2020-06-07 ENCOUNTER — Encounter (HOSPITAL_BASED_OUTPATIENT_CLINIC_OR_DEPARTMENT_OTHER): Payer: Self-pay | Admitting: Orthopedic Surgery

## 2020-07-30 DIAGNOSIS — E7800 Pure hypercholesterolemia, unspecified: Secondary | ICD-10-CM | POA: Diagnosis not present

## 2020-07-30 DIAGNOSIS — I10 Essential (primary) hypertension: Secondary | ICD-10-CM | POA: Diagnosis not present

## 2020-07-30 DIAGNOSIS — K5190 Ulcerative colitis, unspecified, without complications: Secondary | ICD-10-CM | POA: Diagnosis not present

## 2020-07-30 DIAGNOSIS — Z79899 Other long term (current) drug therapy: Secondary | ICD-10-CM | POA: Diagnosis not present

## 2020-07-30 DIAGNOSIS — E1169 Type 2 diabetes mellitus with other specified complication: Secondary | ICD-10-CM | POA: Diagnosis not present

## 2020-07-30 DIAGNOSIS — Z0001 Encounter for general adult medical examination with abnormal findings: Secondary | ICD-10-CM | POA: Diagnosis not present

## 2020-07-30 DIAGNOSIS — G4700 Insomnia, unspecified: Secondary | ICD-10-CM | POA: Diagnosis not present

## 2020-10-11 DIAGNOSIS — N3001 Acute cystitis with hematuria: Secondary | ICD-10-CM | POA: Diagnosis not present

## 2020-10-18 ENCOUNTER — Other Ambulatory Visit (HOSPITAL_BASED_OUTPATIENT_CLINIC_OR_DEPARTMENT_OTHER): Payer: Self-pay | Admitting: Family Medicine

## 2020-10-18 DIAGNOSIS — R319 Hematuria, unspecified: Secondary | ICD-10-CM | POA: Diagnosis not present

## 2020-10-20 ENCOUNTER — Other Ambulatory Visit: Payer: Self-pay

## 2020-10-20 ENCOUNTER — Ambulatory Visit (HOSPITAL_BASED_OUTPATIENT_CLINIC_OR_DEPARTMENT_OTHER)
Admission: RE | Admit: 2020-10-20 | Discharge: 2020-10-20 | Disposition: A | Discharge status: Home / Self Care | Payer: Medicare Other | Source: Ambulatory Visit | Attending: Family Medicine | Admitting: Family Medicine

## 2020-10-20 DIAGNOSIS — R319 Hematuria, unspecified: Secondary | ICD-10-CM | POA: Insufficient documentation

## 2020-10-20 DIAGNOSIS — K5730 Diverticulosis of large intestine without perforation or abscess without bleeding: Secondary | ICD-10-CM | POA: Diagnosis not present

## 2020-10-20 DIAGNOSIS — K7689 Other specified diseases of liver: Secondary | ICD-10-CM | POA: Diagnosis not present

## 2020-10-21 DIAGNOSIS — N2889 Other specified disorders of kidney and ureter: Secondary | ICD-10-CM | POA: Diagnosis not present

## 2020-10-21 DIAGNOSIS — R319 Hematuria, unspecified: Secondary | ICD-10-CM | POA: Diagnosis not present

## 2020-10-27 DIAGNOSIS — N2889 Other specified disorders of kidney and ureter: Secondary | ICD-10-CM | POA: Diagnosis not present

## 2020-10-27 DIAGNOSIS — R278 Other lack of coordination: Secondary | ICD-10-CM | POA: Diagnosis not present

## 2020-11-04 DIAGNOSIS — E278 Other specified disorders of adrenal gland: Secondary | ICD-10-CM | POA: Diagnosis not present

## 2020-11-04 DIAGNOSIS — C649 Malignant neoplasm of unspecified kidney, except renal pelvis: Secondary | ICD-10-CM | POA: Diagnosis not present

## 2020-11-04 DIAGNOSIS — Z859 Personal history of malignant neoplasm, unspecified: Secondary | ICD-10-CM | POA: Diagnosis not present

## 2020-11-04 DIAGNOSIS — N2889 Other specified disorders of kidney and ureter: Secondary | ICD-10-CM | POA: Diagnosis not present

## 2020-11-04 DIAGNOSIS — R918 Other nonspecific abnormal finding of lung field: Secondary | ICD-10-CM | POA: Diagnosis not present

## 2020-11-04 DIAGNOSIS — C642 Malignant neoplasm of left kidney, except renal pelvis: Secondary | ICD-10-CM | POA: Diagnosis not present

## 2020-11-08 DIAGNOSIS — Z859 Personal history of malignant neoplasm, unspecified: Secondary | ICD-10-CM | POA: Diagnosis not present

## 2020-11-08 DIAGNOSIS — E278 Other specified disorders of adrenal gland: Secondary | ICD-10-CM | POA: Diagnosis not present

## 2020-11-17 DIAGNOSIS — Z859 Personal history of malignant neoplasm, unspecified: Secondary | ICD-10-CM | POA: Diagnosis not present

## 2020-11-17 DIAGNOSIS — E278 Other specified disorders of adrenal gland: Secondary | ICD-10-CM | POA: Diagnosis not present

## 2020-11-19 DIAGNOSIS — R310 Gross hematuria: Secondary | ICD-10-CM | POA: Diagnosis not present

## 2020-11-19 DIAGNOSIS — R319 Hematuria, unspecified: Secondary | ICD-10-CM | POA: Diagnosis not present

## 2020-11-19 DIAGNOSIS — I10 Essential (primary) hypertension: Secondary | ICD-10-CM | POA: Diagnosis not present

## 2020-11-19 DIAGNOSIS — N3289 Other specified disorders of bladder: Secondary | ICD-10-CM | POA: Diagnosis not present

## 2020-11-19 DIAGNOSIS — C642 Malignant neoplasm of left kidney, except renal pelvis: Secondary | ICD-10-CM | POA: Diagnosis not present

## 2020-11-19 DIAGNOSIS — Z79899 Other long term (current) drug therapy: Secondary | ICD-10-CM | POA: Diagnosis not present

## 2020-11-19 DIAGNOSIS — Z87891 Personal history of nicotine dependence: Secondary | ICD-10-CM | POA: Diagnosis not present

## 2020-11-19 DIAGNOSIS — N1330 Unspecified hydronephrosis: Secondary | ICD-10-CM | POA: Diagnosis not present

## 2020-11-19 DIAGNOSIS — K5730 Diverticulosis of large intestine without perforation or abscess without bleeding: Secondary | ICD-10-CM | POA: Diagnosis not present

## 2020-11-19 DIAGNOSIS — E119 Type 2 diabetes mellitus without complications: Secondary | ICD-10-CM | POA: Diagnosis not present

## 2020-11-20 DIAGNOSIS — R319 Hematuria, unspecified: Secondary | ICD-10-CM | POA: Diagnosis not present

## 2020-11-20 DIAGNOSIS — N3289 Other specified disorders of bladder: Secondary | ICD-10-CM | POA: Diagnosis not present

## 2020-11-20 DIAGNOSIS — K5730 Diverticulosis of large intestine without perforation or abscess without bleeding: Secondary | ICD-10-CM | POA: Diagnosis not present

## 2020-11-20 DIAGNOSIS — N1330 Unspecified hydronephrosis: Secondary | ICD-10-CM | POA: Diagnosis not present

## 2020-11-29 DIAGNOSIS — E278 Other specified disorders of adrenal gland: Secondary | ICD-10-CM | POA: Diagnosis not present

## 2020-11-29 DIAGNOSIS — R310 Gross hematuria: Secondary | ICD-10-CM | POA: Diagnosis not present

## 2020-11-29 DIAGNOSIS — N2889 Other specified disorders of kidney and ureter: Secondary | ICD-10-CM | POA: Diagnosis not present

## 2020-11-29 DIAGNOSIS — Z859 Personal history of malignant neoplasm, unspecified: Secondary | ICD-10-CM | POA: Diagnosis not present

## 2020-12-02 DIAGNOSIS — Z01818 Encounter for other preprocedural examination: Secondary | ICD-10-CM | POA: Diagnosis not present

## 2020-12-02 DIAGNOSIS — Z01812 Encounter for preprocedural laboratory examination: Secondary | ICD-10-CM | POA: Diagnosis not present

## 2020-12-02 DIAGNOSIS — Z79899 Other long term (current) drug therapy: Secondary | ICD-10-CM | POA: Diagnosis not present

## 2020-12-02 DIAGNOSIS — N2889 Other specified disorders of kidney and ureter: Secondary | ICD-10-CM | POA: Diagnosis not present

## 2020-12-02 DIAGNOSIS — Z01810 Encounter for preprocedural cardiovascular examination: Secondary | ICD-10-CM | POA: Diagnosis not present

## 2020-12-06 DIAGNOSIS — C7491 Malignant neoplasm of unspecified part of right adrenal gland: Secondary | ICD-10-CM | POA: Diagnosis not present

## 2020-12-06 DIAGNOSIS — Z79899 Other long term (current) drug therapy: Secondary | ICD-10-CM | POA: Diagnosis not present

## 2020-12-06 DIAGNOSIS — R634 Abnormal weight loss: Secondary | ICD-10-CM | POA: Diagnosis not present

## 2020-12-06 DIAGNOSIS — D62 Acute posthemorrhagic anemia: Secondary | ICD-10-CM | POA: Diagnosis not present

## 2020-12-06 DIAGNOSIS — E278 Other specified disorders of adrenal gland: Secondary | ICD-10-CM | POA: Diagnosis not present

## 2020-12-06 DIAGNOSIS — N2889 Other specified disorders of kidney and ureter: Secondary | ICD-10-CM | POA: Diagnosis not present

## 2020-12-06 DIAGNOSIS — N281 Cyst of kidney, acquired: Secondary | ICD-10-CM | POA: Diagnosis not present

## 2020-12-06 DIAGNOSIS — C7971 Secondary malignant neoplasm of right adrenal gland: Secondary | ICD-10-CM | POA: Diagnosis not present

## 2020-12-06 DIAGNOSIS — Z888 Allergy status to other drugs, medicaments and biological substances status: Secondary | ICD-10-CM | POA: Diagnosis not present

## 2020-12-06 DIAGNOSIS — Z7984 Long term (current) use of oral hypoglycemic drugs: Secondary | ICD-10-CM | POA: Diagnosis not present

## 2020-12-06 DIAGNOSIS — R579 Shock, unspecified: Secondary | ICD-10-CM | POA: Diagnosis not present

## 2020-12-06 DIAGNOSIS — K5730 Diverticulosis of large intestine without perforation or abscess without bleeding: Secondary | ICD-10-CM | POA: Diagnosis not present

## 2020-12-06 DIAGNOSIS — E119 Type 2 diabetes mellitus without complications: Secondary | ICD-10-CM | POA: Diagnosis not present

## 2020-12-06 DIAGNOSIS — C641 Malignant neoplasm of right kidney, except renal pelvis: Secondary | ICD-10-CM | POA: Diagnosis not present

## 2020-12-06 DIAGNOSIS — C642 Malignant neoplasm of left kidney, except renal pelvis: Secondary | ICD-10-CM | POA: Diagnosis not present

## 2020-12-06 DIAGNOSIS — E2740 Unspecified adrenocortical insufficiency: Secondary | ICD-10-CM | POA: Diagnosis not present

## 2020-12-06 DIAGNOSIS — K7689 Other specified diseases of liver: Secondary | ICD-10-CM | POA: Diagnosis not present

## 2020-12-06 DIAGNOSIS — N179 Acute kidney failure, unspecified: Secondary | ICD-10-CM | POA: Diagnosis not present

## 2020-12-06 DIAGNOSIS — J9601 Acute respiratory failure with hypoxia: Secondary | ICD-10-CM | POA: Diagnosis not present

## 2020-12-06 DIAGNOSIS — K828 Other specified diseases of gallbladder: Secondary | ICD-10-CM | POA: Diagnosis not present

## 2020-12-06 DIAGNOSIS — I82220 Acute embolism and thrombosis of inferior vena cava: Secondary | ICD-10-CM | POA: Diagnosis not present

## 2020-12-06 DIAGNOSIS — I10 Essential (primary) hypertension: Secondary | ICD-10-CM | POA: Diagnosis not present

## 2020-12-06 DIAGNOSIS — R310 Gross hematuria: Secondary | ICD-10-CM | POA: Diagnosis not present

## 2020-12-06 DIAGNOSIS — Z4682 Encounter for fitting and adjustment of non-vascular catheter: Secondary | ICD-10-CM | POA: Diagnosis not present

## 2020-12-06 DIAGNOSIS — R0689 Other abnormalities of breathing: Secondary | ICD-10-CM | POA: Diagnosis not present

## 2020-12-06 DIAGNOSIS — R578 Other shock: Secondary | ICD-10-CM | POA: Diagnosis not present

## 2020-12-06 DIAGNOSIS — E785 Hyperlipidemia, unspecified: Secondary | ICD-10-CM | POA: Diagnosis not present

## 2020-12-06 DIAGNOSIS — N3289 Other specified disorders of bladder: Secondary | ICD-10-CM | POA: Diagnosis not present

## 2020-12-06 DIAGNOSIS — E279 Disorder of adrenal gland, unspecified: Secondary | ICD-10-CM | POA: Diagnosis not present

## 2020-12-06 DIAGNOSIS — Z87891 Personal history of nicotine dependence: Secondary | ICD-10-CM | POA: Diagnosis not present

## 2020-12-06 DIAGNOSIS — Z859 Personal history of malignant neoplasm, unspecified: Secondary | ICD-10-CM | POA: Diagnosis not present

## 2020-12-06 DIAGNOSIS — J984 Other disorders of lung: Secondary | ICD-10-CM | POA: Diagnosis not present

## 2020-12-06 DIAGNOSIS — K5090 Crohn's disease, unspecified, without complications: Secondary | ICD-10-CM | POA: Diagnosis not present

## 2020-12-06 DIAGNOSIS — I823 Embolism and thrombosis of renal vein: Secondary | ICD-10-CM | POA: Diagnosis not present

## 2020-12-06 DIAGNOSIS — D49512 Neoplasm of unspecified behavior of left kidney: Secondary | ICD-10-CM | POA: Diagnosis not present

## 2020-12-07 DIAGNOSIS — R0689 Other abnormalities of breathing: Secondary | ICD-10-CM | POA: Diagnosis not present

## 2020-12-07 DIAGNOSIS — R578 Other shock: Secondary | ICD-10-CM | POA: Diagnosis not present

## 2020-12-07 DIAGNOSIS — N2889 Other specified disorders of kidney and ureter: Secondary | ICD-10-CM | POA: Diagnosis not present

## 2020-12-08 DIAGNOSIS — R0689 Other abnormalities of breathing: Secondary | ICD-10-CM | POA: Diagnosis not present

## 2020-12-08 DIAGNOSIS — N2889 Other specified disorders of kidney and ureter: Secondary | ICD-10-CM | POA: Diagnosis not present

## 2020-12-08 DIAGNOSIS — R578 Other shock: Secondary | ICD-10-CM | POA: Diagnosis not present

## 2020-12-12 DIAGNOSIS — K7689 Other specified diseases of liver: Secondary | ICD-10-CM | POA: Diagnosis not present

## 2020-12-12 DIAGNOSIS — N281 Cyst of kidney, acquired: Secondary | ICD-10-CM | POA: Diagnosis not present

## 2020-12-12 DIAGNOSIS — N3289 Other specified disorders of bladder: Secondary | ICD-10-CM | POA: Diagnosis not present

## 2020-12-12 DIAGNOSIS — K828 Other specified diseases of gallbladder: Secondary | ICD-10-CM | POA: Diagnosis not present

## 2020-12-20 DIAGNOSIS — D649 Anemia, unspecified: Secondary | ICD-10-CM | POA: Diagnosis not present

## 2020-12-20 DIAGNOSIS — Z905 Acquired absence of kidney: Secondary | ICD-10-CM | POA: Diagnosis not present

## 2020-12-28 DIAGNOSIS — Z905 Acquired absence of kidney: Secondary | ICD-10-CM | POA: Diagnosis not present

## 2020-12-28 DIAGNOSIS — E2740 Unspecified adrenocortical insufficiency: Secondary | ICD-10-CM | POA: Diagnosis not present

## 2020-12-30 DIAGNOSIS — C642 Malignant neoplasm of left kidney, except renal pelvis: Secondary | ICD-10-CM | POA: Diagnosis not present

## 2020-12-30 DIAGNOSIS — Z905 Acquired absence of kidney: Secondary | ICD-10-CM | POA: Diagnosis not present

## 2021-01-11 DIAGNOSIS — C642 Malignant neoplasm of left kidney, except renal pelvis: Secondary | ICD-10-CM | POA: Diagnosis not present

## 2021-01-11 DIAGNOSIS — E2740 Unspecified adrenocortical insufficiency: Secondary | ICD-10-CM | POA: Diagnosis not present

## 2021-01-11 DIAGNOSIS — E785 Hyperlipidemia, unspecified: Secondary | ICD-10-CM | POA: Diagnosis not present

## 2021-01-11 DIAGNOSIS — I10 Essential (primary) hypertension: Secondary | ICD-10-CM | POA: Diagnosis not present

## 2021-01-14 DIAGNOSIS — E2740 Unspecified adrenocortical insufficiency: Secondary | ICD-10-CM | POA: Diagnosis not present

## 2021-01-24 DIAGNOSIS — E271 Primary adrenocortical insufficiency: Secondary | ICD-10-CM | POA: Diagnosis not present

## 2021-01-31 DIAGNOSIS — E2740 Unspecified adrenocortical insufficiency: Secondary | ICD-10-CM | POA: Diagnosis not present

## 2021-01-31 DIAGNOSIS — E1169 Type 2 diabetes mellitus with other specified complication: Secondary | ICD-10-CM | POA: Diagnosis not present

## 2021-01-31 DIAGNOSIS — E7800 Pure hypercholesterolemia, unspecified: Secondary | ICD-10-CM | POA: Diagnosis not present

## 2021-01-31 DIAGNOSIS — C642 Malignant neoplasm of left kidney, except renal pelvis: Secondary | ICD-10-CM | POA: Diagnosis not present

## 2021-02-28 DIAGNOSIS — I10 Essential (primary) hypertension: Secondary | ICD-10-CM | POA: Diagnosis not present

## 2021-02-28 DIAGNOSIS — E271 Primary adrenocortical insufficiency: Secondary | ICD-10-CM | POA: Diagnosis not present

## 2021-02-28 DIAGNOSIS — E119 Type 2 diabetes mellitus without complications: Secondary | ICD-10-CM | POA: Diagnosis not present

## 2021-02-28 DIAGNOSIS — E2740 Unspecified adrenocortical insufficiency: Secondary | ICD-10-CM | POA: Diagnosis not present

## 2021-03-16 DIAGNOSIS — I2510 Atherosclerotic heart disease of native coronary artery without angina pectoris: Secondary | ICD-10-CM | POA: Diagnosis not present

## 2021-03-16 DIAGNOSIS — J9811 Atelectasis: Secondary | ICD-10-CM | POA: Diagnosis not present

## 2021-03-16 DIAGNOSIS — N281 Cyst of kidney, acquired: Secondary | ICD-10-CM | POA: Diagnosis not present

## 2021-03-16 DIAGNOSIS — K7689 Other specified diseases of liver: Secondary | ICD-10-CM | POA: Diagnosis not present

## 2021-03-16 DIAGNOSIS — C649 Malignant neoplasm of unspecified kidney, except renal pelvis: Secondary | ICD-10-CM | POA: Diagnosis not present

## 2021-03-16 DIAGNOSIS — R918 Other nonspecific abnormal finding of lung field: Secondary | ICD-10-CM | POA: Diagnosis not present

## 2021-03-16 DIAGNOSIS — C642 Malignant neoplasm of left kidney, except renal pelvis: Secondary | ICD-10-CM | POA: Diagnosis not present

## 2021-03-21 DIAGNOSIS — E119 Type 2 diabetes mellitus without complications: Secondary | ICD-10-CM | POA: Diagnosis not present

## 2021-03-21 DIAGNOSIS — C642 Malignant neoplasm of left kidney, except renal pelvis: Secondary | ICD-10-CM | POA: Diagnosis not present

## 2021-03-23 DIAGNOSIS — R310 Gross hematuria: Secondary | ICD-10-CM | POA: Diagnosis not present

## 2021-03-23 DIAGNOSIS — E2740 Unspecified adrenocortical insufficiency: Secondary | ICD-10-CM | POA: Diagnosis not present

## 2021-03-23 DIAGNOSIS — C642 Malignant neoplasm of left kidney, except renal pelvis: Secondary | ICD-10-CM | POA: Diagnosis not present

## 2021-03-23 DIAGNOSIS — E278 Other specified disorders of adrenal gland: Secondary | ICD-10-CM | POA: Diagnosis not present

## 2021-03-23 DIAGNOSIS — Z905 Acquired absence of kidney: Secondary | ICD-10-CM | POA: Diagnosis not present

## 2021-03-23 DIAGNOSIS — Z859 Personal history of malignant neoplasm, unspecified: Secondary | ICD-10-CM | POA: Diagnosis not present

## 2021-04-11 DIAGNOSIS — E119 Type 2 diabetes mellitus without complications: Secondary | ICD-10-CM | POA: Diagnosis not present

## 2021-04-11 DIAGNOSIS — C642 Malignant neoplasm of left kidney, except renal pelvis: Secondary | ICD-10-CM | POA: Diagnosis not present

## 2021-05-09 DIAGNOSIS — C642 Malignant neoplasm of left kidney, except renal pelvis: Secondary | ICD-10-CM | POA: Diagnosis not present

## 2021-05-09 DIAGNOSIS — R5383 Other fatigue: Secondary | ICD-10-CM | POA: Diagnosis not present

## 2021-05-25 DIAGNOSIS — Z905 Acquired absence of kidney: Secondary | ICD-10-CM | POA: Diagnosis not present

## 2021-06-08 DIAGNOSIS — R5383 Other fatigue: Secondary | ICD-10-CM | POA: Diagnosis not present

## 2021-06-08 DIAGNOSIS — C642 Malignant neoplasm of left kidney, except renal pelvis: Secondary | ICD-10-CM | POA: Diagnosis not present

## 2021-07-04 DIAGNOSIS — R918 Other nonspecific abnormal finding of lung field: Secondary | ICD-10-CM | POA: Diagnosis not present

## 2021-07-04 DIAGNOSIS — K7689 Other specified diseases of liver: Secondary | ICD-10-CM | POA: Diagnosis not present

## 2021-07-04 DIAGNOSIS — N281 Cyst of kidney, acquired: Secondary | ICD-10-CM | POA: Diagnosis not present

## 2021-07-04 DIAGNOSIS — I2510 Atherosclerotic heart disease of native coronary artery without angina pectoris: Secondary | ICD-10-CM | POA: Diagnosis not present

## 2021-07-04 DIAGNOSIS — C642 Malignant neoplasm of left kidney, except renal pelvis: Secondary | ICD-10-CM | POA: Diagnosis not present

## 2021-07-04 DIAGNOSIS — N3289 Other specified disorders of bladder: Secondary | ICD-10-CM | POA: Diagnosis not present

## 2021-07-06 DIAGNOSIS — N2889 Other specified disorders of kidney and ureter: Secondary | ICD-10-CM | POA: Diagnosis not present

## 2021-07-06 DIAGNOSIS — R519 Headache, unspecified: Secondary | ICD-10-CM | POA: Diagnosis not present

## 2021-07-06 DIAGNOSIS — C642 Malignant neoplasm of left kidney, except renal pelvis: Secondary | ICD-10-CM | POA: Diagnosis not present

## 2021-07-19 DIAGNOSIS — R519 Headache, unspecified: Secondary | ICD-10-CM | POA: Diagnosis not present

## 2021-07-19 DIAGNOSIS — I6781 Acute cerebrovascular insufficiency: Secondary | ICD-10-CM | POA: Diagnosis not present

## 2021-07-19 DIAGNOSIS — C642 Malignant neoplasm of left kidney, except renal pelvis: Secondary | ICD-10-CM | POA: Diagnosis not present

## 2021-07-21 DIAGNOSIS — Z79899 Other long term (current) drug therapy: Secondary | ICD-10-CM | POA: Diagnosis not present

## 2021-07-21 DIAGNOSIS — K5090 Crohn's disease, unspecified, without complications: Secondary | ICD-10-CM | POA: Diagnosis not present

## 2021-07-21 DIAGNOSIS — C642 Malignant neoplasm of left kidney, except renal pelvis: Secondary | ICD-10-CM | POA: Diagnosis not present

## 2021-07-21 DIAGNOSIS — Z5112 Encounter for antineoplastic immunotherapy: Secondary | ICD-10-CM | POA: Diagnosis not present

## 2021-07-21 DIAGNOSIS — Z5181 Encounter for therapeutic drug level monitoring: Secondary | ICD-10-CM | POA: Diagnosis not present

## 2021-07-21 DIAGNOSIS — Z86711 Personal history of pulmonary embolism: Secondary | ICD-10-CM | POA: Diagnosis not present

## 2021-07-21 DIAGNOSIS — I10 Essential (primary) hypertension: Secondary | ICD-10-CM | POA: Diagnosis not present

## 2021-07-21 DIAGNOSIS — D649 Anemia, unspecified: Secondary | ICD-10-CM | POA: Diagnosis not present

## 2021-07-21 DIAGNOSIS — C768 Malignant neoplasm of other specified ill-defined sites: Secondary | ICD-10-CM | POA: Diagnosis not present

## 2021-08-04 DIAGNOSIS — E2740 Unspecified adrenocortical insufficiency: Secondary | ICD-10-CM | POA: Diagnosis not present

## 2021-08-04 DIAGNOSIS — Z5112 Encounter for antineoplastic immunotherapy: Secondary | ICD-10-CM | POA: Diagnosis not present

## 2021-08-04 DIAGNOSIS — E119 Type 2 diabetes mellitus without complications: Secondary | ICD-10-CM | POA: Diagnosis not present

## 2021-08-04 DIAGNOSIS — E1169 Type 2 diabetes mellitus with other specified complication: Secondary | ICD-10-CM | POA: Diagnosis not present

## 2021-08-04 DIAGNOSIS — C642 Malignant neoplasm of left kidney, except renal pelvis: Secondary | ICD-10-CM | POA: Diagnosis not present

## 2021-08-04 DIAGNOSIS — E7800 Pure hypercholesterolemia, unspecified: Secondary | ICD-10-CM | POA: Diagnosis not present

## 2021-08-04 DIAGNOSIS — M545 Low back pain, unspecified: Secondary | ICD-10-CM | POA: Diagnosis not present

## 2021-08-04 DIAGNOSIS — I10 Essential (primary) hypertension: Secondary | ICD-10-CM | POA: Diagnosis not present

## 2021-08-04 DIAGNOSIS — Z79899 Other long term (current) drug therapy: Secondary | ICD-10-CM | POA: Diagnosis not present

## 2021-08-04 DIAGNOSIS — Z0001 Encounter for general adult medical examination with abnormal findings: Secondary | ICD-10-CM | POA: Diagnosis not present

## 2021-08-16 DIAGNOSIS — E2740 Unspecified adrenocortical insufficiency: Secondary | ICD-10-CM | POA: Diagnosis not present

## 2021-08-16 DIAGNOSIS — C642 Malignant neoplasm of left kidney, except renal pelvis: Secondary | ICD-10-CM | POA: Diagnosis not present

## 2021-08-16 DIAGNOSIS — Z86718 Personal history of other venous thrombosis and embolism: Secondary | ICD-10-CM | POA: Diagnosis not present

## 2021-08-16 DIAGNOSIS — K5100 Ulcerative (chronic) pancolitis without complications: Secondary | ICD-10-CM | POA: Diagnosis not present

## 2021-08-18 DIAGNOSIS — Z79899 Other long term (current) drug therapy: Secondary | ICD-10-CM | POA: Diagnosis not present

## 2021-08-18 DIAGNOSIS — Z5112 Encounter for antineoplastic immunotherapy: Secondary | ICD-10-CM | POA: Diagnosis not present

## 2021-08-18 DIAGNOSIS — C642 Malignant neoplasm of left kidney, except renal pelvis: Secondary | ICD-10-CM | POA: Diagnosis not present

## 2021-09-02 DIAGNOSIS — Z5112 Encounter for antineoplastic immunotherapy: Secondary | ICD-10-CM | POA: Diagnosis not present

## 2021-09-02 DIAGNOSIS — C642 Malignant neoplasm of left kidney, except renal pelvis: Secondary | ICD-10-CM | POA: Diagnosis not present

## 2021-09-02 DIAGNOSIS — Z79899 Other long term (current) drug therapy: Secondary | ICD-10-CM | POA: Diagnosis not present

## 2021-09-15 DIAGNOSIS — E119 Type 2 diabetes mellitus without complications: Secondary | ICD-10-CM | POA: Diagnosis not present

## 2021-09-15 DIAGNOSIS — Z905 Acquired absence of kidney: Secondary | ICD-10-CM | POA: Diagnosis not present

## 2021-09-15 DIAGNOSIS — I10 Essential (primary) hypertension: Secondary | ICD-10-CM | POA: Diagnosis not present

## 2021-09-15 DIAGNOSIS — I2699 Other pulmonary embolism without acute cor pulmonale: Secondary | ICD-10-CM | POA: Diagnosis not present

## 2021-09-15 DIAGNOSIS — Z5112 Encounter for antineoplastic immunotherapy: Secondary | ICD-10-CM | POA: Diagnosis not present

## 2021-09-15 DIAGNOSIS — Z86711 Personal history of pulmonary embolism: Secondary | ICD-10-CM | POA: Diagnosis not present

## 2021-09-15 DIAGNOSIS — Z5181 Encounter for therapeutic drug level monitoring: Secondary | ICD-10-CM | POA: Diagnosis not present

## 2021-09-15 DIAGNOSIS — Z79899 Other long term (current) drug therapy: Secondary | ICD-10-CM | POA: Diagnosis not present

## 2021-09-15 DIAGNOSIS — K5090 Crohn's disease, unspecified, without complications: Secondary | ICD-10-CM | POA: Diagnosis not present

## 2021-09-15 DIAGNOSIS — C642 Malignant neoplasm of left kidney, except renal pelvis: Secondary | ICD-10-CM | POA: Diagnosis not present

## 2021-09-15 DIAGNOSIS — Z7901 Long term (current) use of anticoagulants: Secondary | ICD-10-CM | POA: Diagnosis not present

## 2021-09-15 DIAGNOSIS — E271 Primary adrenocortical insufficiency: Secondary | ICD-10-CM | POA: Diagnosis not present

## 2021-09-29 DIAGNOSIS — Z5112 Encounter for antineoplastic immunotherapy: Secondary | ICD-10-CM | POA: Diagnosis not present

## 2021-09-29 DIAGNOSIS — C642 Malignant neoplasm of left kidney, except renal pelvis: Secondary | ICD-10-CM | POA: Diagnosis not present

## 2021-09-29 DIAGNOSIS — E119 Type 2 diabetes mellitus without complications: Secondary | ICD-10-CM | POA: Diagnosis not present

## 2021-09-29 DIAGNOSIS — Z79899 Other long term (current) drug therapy: Secondary | ICD-10-CM | POA: Diagnosis not present

## 2021-09-30 DIAGNOSIS — E271 Primary adrenocortical insufficiency: Secondary | ICD-10-CM | POA: Diagnosis not present

## 2021-10-06 DIAGNOSIS — C642 Malignant neoplasm of left kidney, except renal pelvis: Secondary | ICD-10-CM | POA: Diagnosis not present

## 2021-10-06 DIAGNOSIS — K7689 Other specified diseases of liver: Secondary | ICD-10-CM | POA: Diagnosis not present

## 2021-10-06 DIAGNOSIS — N281 Cyst of kidney, acquired: Secondary | ICD-10-CM | POA: Diagnosis not present

## 2021-10-06 DIAGNOSIS — I2510 Atherosclerotic heart disease of native coronary artery without angina pectoris: Secondary | ICD-10-CM | POA: Diagnosis not present

## 2021-10-06 DIAGNOSIS — R918 Other nonspecific abnormal finding of lung field: Secondary | ICD-10-CM | POA: Diagnosis not present

## 2021-10-13 DIAGNOSIS — Z5112 Encounter for antineoplastic immunotherapy: Secondary | ICD-10-CM | POA: Diagnosis not present

## 2021-10-13 DIAGNOSIS — E119 Type 2 diabetes mellitus without complications: Secondary | ICD-10-CM | POA: Diagnosis not present

## 2021-10-13 DIAGNOSIS — Z79899 Other long term (current) drug therapy: Secondary | ICD-10-CM | POA: Diagnosis not present

## 2021-10-13 DIAGNOSIS — C642 Malignant neoplasm of left kidney, except renal pelvis: Secondary | ICD-10-CM | POA: Diagnosis not present

## 2021-10-27 DIAGNOSIS — C642 Malignant neoplasm of left kidney, except renal pelvis: Secondary | ICD-10-CM | POA: Diagnosis not present

## 2021-10-27 DIAGNOSIS — Z5112 Encounter for antineoplastic immunotherapy: Secondary | ICD-10-CM | POA: Diagnosis not present

## 2021-10-27 DIAGNOSIS — Z79899 Other long term (current) drug therapy: Secondary | ICD-10-CM | POA: Diagnosis not present

## 2021-11-10 DIAGNOSIS — C642 Malignant neoplasm of left kidney, except renal pelvis: Secondary | ICD-10-CM | POA: Diagnosis not present

## 2021-11-10 DIAGNOSIS — Z79899 Other long term (current) drug therapy: Secondary | ICD-10-CM | POA: Diagnosis not present

## 2021-11-10 DIAGNOSIS — Z5112 Encounter for antineoplastic immunotherapy: Secondary | ICD-10-CM | POA: Diagnosis not present

## 2021-11-24 DIAGNOSIS — Z5112 Encounter for antineoplastic immunotherapy: Secondary | ICD-10-CM | POA: Diagnosis not present

## 2021-11-24 DIAGNOSIS — Z79899 Other long term (current) drug therapy: Secondary | ICD-10-CM | POA: Diagnosis not present

## 2021-11-24 DIAGNOSIS — C642 Malignant neoplasm of left kidney, except renal pelvis: Secondary | ICD-10-CM | POA: Diagnosis not present

## 2021-11-24 DIAGNOSIS — N2889 Other specified disorders of kidney and ureter: Secondary | ICD-10-CM | POA: Diagnosis not present

## 2021-12-08 DIAGNOSIS — C642 Malignant neoplasm of left kidney, except renal pelvis: Secondary | ICD-10-CM | POA: Diagnosis not present

## 2021-12-08 DIAGNOSIS — Z79899 Other long term (current) drug therapy: Secondary | ICD-10-CM | POA: Diagnosis not present

## 2021-12-08 DIAGNOSIS — N2889 Other specified disorders of kidney and ureter: Secondary | ICD-10-CM | POA: Diagnosis not present

## 2021-12-08 DIAGNOSIS — Z5112 Encounter for antineoplastic immunotherapy: Secondary | ICD-10-CM | POA: Diagnosis not present

## 2021-12-22 DIAGNOSIS — Z79899 Other long term (current) drug therapy: Secondary | ICD-10-CM | POA: Diagnosis not present

## 2021-12-22 DIAGNOSIS — C642 Malignant neoplasm of left kidney, except renal pelvis: Secondary | ICD-10-CM | POA: Diagnosis not present

## 2021-12-22 DIAGNOSIS — Z5112 Encounter for antineoplastic immunotherapy: Secondary | ICD-10-CM | POA: Diagnosis not present

## 2022-01-05 DIAGNOSIS — Z79899 Other long term (current) drug therapy: Secondary | ICD-10-CM | POA: Diagnosis not present

## 2022-01-05 DIAGNOSIS — M25519 Pain in unspecified shoulder: Secondary | ICD-10-CM | POA: Diagnosis not present

## 2022-01-05 DIAGNOSIS — E119 Type 2 diabetes mellitus without complications: Secondary | ICD-10-CM | POA: Diagnosis not present

## 2022-01-05 DIAGNOSIS — Z5112 Encounter for antineoplastic immunotherapy: Secondary | ICD-10-CM | POA: Diagnosis not present

## 2022-01-05 DIAGNOSIS — C642 Malignant neoplasm of left kidney, except renal pelvis: Secondary | ICD-10-CM | POA: Diagnosis not present

## 2022-01-19 DIAGNOSIS — C642 Malignant neoplasm of left kidney, except renal pelvis: Secondary | ICD-10-CM | POA: Diagnosis not present

## 2022-01-19 DIAGNOSIS — Z5112 Encounter for antineoplastic immunotherapy: Secondary | ICD-10-CM | POA: Diagnosis not present

## 2022-01-19 DIAGNOSIS — Z79899 Other long term (current) drug therapy: Secondary | ICD-10-CM | POA: Diagnosis not present

## 2022-01-20 DIAGNOSIS — M25512 Pain in left shoulder: Secondary | ICD-10-CM | POA: Diagnosis not present

## 2022-01-20 DIAGNOSIS — G8929 Other chronic pain: Secondary | ICD-10-CM | POA: Diagnosis not present

## 2022-01-30 DIAGNOSIS — N3289 Other specified disorders of bladder: Secondary | ICD-10-CM | POA: Diagnosis not present

## 2022-01-30 DIAGNOSIS — I2510 Atherosclerotic heart disease of native coronary artery without angina pectoris: Secondary | ICD-10-CM | POA: Diagnosis not present

## 2022-01-30 DIAGNOSIS — C7802 Secondary malignant neoplasm of left lung: Secondary | ICD-10-CM | POA: Diagnosis not present

## 2022-01-30 DIAGNOSIS — C642 Malignant neoplasm of left kidney, except renal pelvis: Secondary | ICD-10-CM | POA: Diagnosis not present

## 2022-01-30 DIAGNOSIS — C7801 Secondary malignant neoplasm of right lung: Secondary | ICD-10-CM | POA: Diagnosis not present

## 2022-02-02 DIAGNOSIS — N2889 Other specified disorders of kidney and ureter: Secondary | ICD-10-CM | POA: Diagnosis not present

## 2022-02-02 DIAGNOSIS — Z5112 Encounter for antineoplastic immunotherapy: Secondary | ICD-10-CM | POA: Diagnosis not present

## 2022-02-02 DIAGNOSIS — Z79899 Other long term (current) drug therapy: Secondary | ICD-10-CM | POA: Diagnosis not present

## 2022-02-02 DIAGNOSIS — C642 Malignant neoplasm of left kidney, except renal pelvis: Secondary | ICD-10-CM | POA: Diagnosis not present

## 2022-02-07 DIAGNOSIS — Z23 Encounter for immunization: Secondary | ICD-10-CM | POA: Diagnosis not present

## 2022-02-07 DIAGNOSIS — E2740 Unspecified adrenocortical insufficiency: Secondary | ICD-10-CM | POA: Diagnosis not present

## 2022-02-07 DIAGNOSIS — E7800 Pure hypercholesterolemia, unspecified: Secondary | ICD-10-CM | POA: Diagnosis not present

## 2022-02-07 DIAGNOSIS — I499 Cardiac arrhythmia, unspecified: Secondary | ICD-10-CM | POA: Diagnosis not present

## 2022-02-07 DIAGNOSIS — K5100 Ulcerative (chronic) pancolitis without complications: Secondary | ICD-10-CM | POA: Diagnosis not present

## 2022-02-07 DIAGNOSIS — C642 Malignant neoplasm of left kidney, except renal pelvis: Secondary | ICD-10-CM | POA: Diagnosis not present

## 2022-02-07 DIAGNOSIS — I10 Essential (primary) hypertension: Secondary | ICD-10-CM | POA: Diagnosis not present

## 2022-02-07 DIAGNOSIS — E1169 Type 2 diabetes mellitus with other specified complication: Secondary | ICD-10-CM | POA: Diagnosis not present

## 2022-02-16 DIAGNOSIS — Z5112 Encounter for antineoplastic immunotherapy: Secondary | ICD-10-CM | POA: Diagnosis not present

## 2022-02-16 DIAGNOSIS — Z79899 Other long term (current) drug therapy: Secondary | ICD-10-CM | POA: Diagnosis not present

## 2022-02-16 DIAGNOSIS — N2889 Other specified disorders of kidney and ureter: Secondary | ICD-10-CM | POA: Diagnosis not present

## 2022-02-16 DIAGNOSIS — C642 Malignant neoplasm of left kidney, except renal pelvis: Secondary | ICD-10-CM | POA: Diagnosis not present

## 2022-03-02 DIAGNOSIS — C642 Malignant neoplasm of left kidney, except renal pelvis: Secondary | ICD-10-CM | POA: Diagnosis not present

## 2022-03-02 DIAGNOSIS — K5090 Crohn's disease, unspecified, without complications: Secondary | ICD-10-CM | POA: Diagnosis not present

## 2022-03-02 DIAGNOSIS — E1122 Type 2 diabetes mellitus with diabetic chronic kidney disease: Secondary | ICD-10-CM | POA: Diagnosis not present

## 2022-03-02 DIAGNOSIS — Z79899 Other long term (current) drug therapy: Secondary | ICD-10-CM | POA: Diagnosis not present

## 2022-03-02 DIAGNOSIS — Z7901 Long term (current) use of anticoagulants: Secondary | ICD-10-CM | POA: Diagnosis not present

## 2022-03-02 DIAGNOSIS — E896 Postprocedural adrenocortical (-medullary) hypofunction: Secondary | ICD-10-CM | POA: Diagnosis not present

## 2022-03-02 DIAGNOSIS — Z5112 Encounter for antineoplastic immunotherapy: Secondary | ICD-10-CM | POA: Diagnosis not present

## 2022-03-02 DIAGNOSIS — N189 Chronic kidney disease, unspecified: Secondary | ICD-10-CM | POA: Diagnosis not present

## 2022-03-02 DIAGNOSIS — E1169 Type 2 diabetes mellitus with other specified complication: Secondary | ICD-10-CM | POA: Diagnosis not present

## 2022-03-02 DIAGNOSIS — Z86711 Personal history of pulmonary embolism: Secondary | ICD-10-CM | POA: Diagnosis not present

## 2022-03-02 DIAGNOSIS — E785 Hyperlipidemia, unspecified: Secondary | ICD-10-CM | POA: Diagnosis not present

## 2022-03-02 DIAGNOSIS — M25519 Pain in unspecified shoulder: Secondary | ICD-10-CM | POA: Diagnosis not present

## 2022-03-02 DIAGNOSIS — I129 Hypertensive chronic kidney disease with stage 1 through stage 4 chronic kidney disease, or unspecified chronic kidney disease: Secondary | ICD-10-CM | POA: Diagnosis not present

## 2022-03-02 DIAGNOSIS — Z5181 Encounter for therapeutic drug level monitoring: Secondary | ICD-10-CM | POA: Diagnosis not present

## 2022-03-02 DIAGNOSIS — Z905 Acquired absence of kidney: Secondary | ICD-10-CM | POA: Diagnosis not present

## 2022-03-16 DIAGNOSIS — Z79899 Other long term (current) drug therapy: Secondary | ICD-10-CM | POA: Diagnosis not present

## 2022-03-16 DIAGNOSIS — Z5112 Encounter for antineoplastic immunotherapy: Secondary | ICD-10-CM | POA: Diagnosis not present

## 2022-03-16 DIAGNOSIS — C642 Malignant neoplasm of left kidney, except renal pelvis: Secondary | ICD-10-CM | POA: Diagnosis not present

## 2022-03-30 DIAGNOSIS — C642 Malignant neoplasm of left kidney, except renal pelvis: Secondary | ICD-10-CM | POA: Diagnosis not present

## 2022-03-30 DIAGNOSIS — Z79899 Other long term (current) drug therapy: Secondary | ICD-10-CM | POA: Diagnosis not present

## 2022-03-30 DIAGNOSIS — Z5112 Encounter for antineoplastic immunotherapy: Secondary | ICD-10-CM | POA: Diagnosis not present

## 2022-03-30 DIAGNOSIS — E119 Type 2 diabetes mellitus without complications: Secondary | ICD-10-CM | POA: Diagnosis not present

## 2022-03-30 DIAGNOSIS — I10 Essential (primary) hypertension: Secondary | ICD-10-CM | POA: Diagnosis not present

## 2022-04-13 DIAGNOSIS — C642 Malignant neoplasm of left kidney, except renal pelvis: Secondary | ICD-10-CM | POA: Diagnosis not present

## 2022-04-13 DIAGNOSIS — Z79899 Other long term (current) drug therapy: Secondary | ICD-10-CM | POA: Diagnosis not present

## 2022-04-13 DIAGNOSIS — Z5112 Encounter for antineoplastic immunotherapy: Secondary | ICD-10-CM | POA: Diagnosis not present

## 2022-04-27 DIAGNOSIS — C7971 Secondary malignant neoplasm of right adrenal gland: Secondary | ICD-10-CM | POA: Diagnosis not present

## 2022-04-27 DIAGNOSIS — Z5112 Encounter for antineoplastic immunotherapy: Secondary | ICD-10-CM | POA: Diagnosis not present

## 2022-04-27 DIAGNOSIS — N1831 Chronic kidney disease, stage 3a: Secondary | ICD-10-CM | POA: Diagnosis not present

## 2022-04-27 DIAGNOSIS — C642 Malignant neoplasm of left kidney, except renal pelvis: Secondary | ICD-10-CM | POA: Diagnosis not present

## 2022-04-27 DIAGNOSIS — E1122 Type 2 diabetes mellitus with diabetic chronic kidney disease: Secondary | ICD-10-CM | POA: Diagnosis not present

## 2022-04-27 DIAGNOSIS — Z79620 Long term (current) use of immunosuppressive biologic: Secondary | ICD-10-CM | POA: Diagnosis not present

## 2022-04-27 DIAGNOSIS — C7802 Secondary malignant neoplasm of left lung: Secondary | ICD-10-CM | POA: Diagnosis not present

## 2022-04-27 DIAGNOSIS — M25519 Pain in unspecified shoulder: Secondary | ICD-10-CM | POA: Diagnosis not present

## 2022-04-27 DIAGNOSIS — I129 Hypertensive chronic kidney disease with stage 1 through stage 4 chronic kidney disease, or unspecified chronic kidney disease: Secondary | ICD-10-CM | POA: Diagnosis not present

## 2022-04-27 DIAGNOSIS — E2740 Unspecified adrenocortical insufficiency: Secondary | ICD-10-CM | POA: Diagnosis not present

## 2022-04-27 DIAGNOSIS — D649 Anemia, unspecified: Secondary | ICD-10-CM | POA: Diagnosis not present

## 2022-04-27 DIAGNOSIS — C7801 Secondary malignant neoplasm of right lung: Secondary | ICD-10-CM | POA: Diagnosis not present

## 2022-04-27 DIAGNOSIS — D630 Anemia in neoplastic disease: Secondary | ICD-10-CM | POA: Diagnosis not present

## 2022-04-27 DIAGNOSIS — Z79899 Other long term (current) drug therapy: Secondary | ICD-10-CM | POA: Diagnosis not present

## 2022-04-27 DIAGNOSIS — E785 Hyperlipidemia, unspecified: Secondary | ICD-10-CM | POA: Diagnosis not present

## 2022-04-27 DIAGNOSIS — D631 Anemia in chronic kidney disease: Secondary | ICD-10-CM | POA: Diagnosis not present

## 2022-04-27 DIAGNOSIS — Z7984 Long term (current) use of oral hypoglycemic drugs: Secondary | ICD-10-CM | POA: Diagnosis not present

## 2022-04-27 DIAGNOSIS — K5090 Crohn's disease, unspecified, without complications: Secondary | ICD-10-CM | POA: Diagnosis not present

## 2022-05-11 DIAGNOSIS — Z5112 Encounter for antineoplastic immunotherapy: Secondary | ICD-10-CM | POA: Diagnosis not present

## 2022-05-11 DIAGNOSIS — C642 Malignant neoplasm of left kidney, except renal pelvis: Secondary | ICD-10-CM | POA: Diagnosis not present

## 2022-05-11 DIAGNOSIS — K5100 Ulcerative (chronic) pancolitis without complications: Secondary | ICD-10-CM | POA: Diagnosis not present

## 2022-05-11 DIAGNOSIS — Z86718 Personal history of other venous thrombosis and embolism: Secondary | ICD-10-CM | POA: Diagnosis not present

## 2022-05-11 DIAGNOSIS — Z79899 Other long term (current) drug therapy: Secondary | ICD-10-CM | POA: Diagnosis not present

## 2022-05-11 DIAGNOSIS — E2740 Unspecified adrenocortical insufficiency: Secondary | ICD-10-CM | POA: Diagnosis not present

## 2022-05-25 DIAGNOSIS — Z5112 Encounter for antineoplastic immunotherapy: Secondary | ICD-10-CM | POA: Diagnosis not present

## 2022-05-25 DIAGNOSIS — Z79899 Other long term (current) drug therapy: Secondary | ICD-10-CM | POA: Diagnosis not present

## 2022-05-25 DIAGNOSIS — C642 Malignant neoplasm of left kidney, except renal pelvis: Secondary | ICD-10-CM | POA: Diagnosis not present

## 2022-06-02 DIAGNOSIS — C642 Malignant neoplasm of left kidney, except renal pelvis: Secondary | ICD-10-CM | POA: Diagnosis not present

## 2022-06-02 DIAGNOSIS — I700 Atherosclerosis of aorta: Secondary | ICD-10-CM | POA: Diagnosis not present

## 2022-06-02 DIAGNOSIS — N281 Cyst of kidney, acquired: Secondary | ICD-10-CM | POA: Diagnosis not present

## 2022-06-02 DIAGNOSIS — R918 Other nonspecific abnormal finding of lung field: Secondary | ICD-10-CM | POA: Diagnosis not present

## 2022-06-02 DIAGNOSIS — K7689 Other specified diseases of liver: Secondary | ICD-10-CM | POA: Diagnosis not present

## 2022-06-02 DIAGNOSIS — I2510 Atherosclerotic heart disease of native coronary artery without angina pectoris: Secondary | ICD-10-CM | POA: Diagnosis not present

## 2022-06-08 DIAGNOSIS — Z79899 Other long term (current) drug therapy: Secondary | ICD-10-CM | POA: Diagnosis not present

## 2022-06-08 DIAGNOSIS — C642 Malignant neoplasm of left kidney, except renal pelvis: Secondary | ICD-10-CM | POA: Diagnosis not present

## 2022-06-08 DIAGNOSIS — Z5112 Encounter for antineoplastic immunotherapy: Secondary | ICD-10-CM | POA: Diagnosis not present

## 2022-06-22 DIAGNOSIS — Z5112 Encounter for antineoplastic immunotherapy: Secondary | ICD-10-CM | POA: Diagnosis not present

## 2022-06-22 DIAGNOSIS — C642 Malignant neoplasm of left kidney, except renal pelvis: Secondary | ICD-10-CM | POA: Diagnosis not present

## 2022-06-22 DIAGNOSIS — Z79899 Other long term (current) drug therapy: Secondary | ICD-10-CM | POA: Diagnosis not present

## 2022-07-06 DIAGNOSIS — D631 Anemia in chronic kidney disease: Secondary | ICD-10-CM | POA: Diagnosis not present

## 2022-07-06 DIAGNOSIS — K5090 Crohn's disease, unspecified, without complications: Secondary | ICD-10-CM | POA: Diagnosis not present

## 2022-07-06 DIAGNOSIS — Z5112 Encounter for antineoplastic immunotherapy: Secondary | ICD-10-CM | POA: Diagnosis not present

## 2022-07-06 DIAGNOSIS — M25519 Pain in unspecified shoulder: Secondary | ICD-10-CM | POA: Diagnosis not present

## 2022-07-06 DIAGNOSIS — D630 Anemia in neoplastic disease: Secondary | ICD-10-CM | POA: Diagnosis not present

## 2022-07-06 DIAGNOSIS — Z79899 Other long term (current) drug therapy: Secondary | ICD-10-CM | POA: Diagnosis not present

## 2022-07-06 DIAGNOSIS — E896 Postprocedural adrenocortical (-medullary) hypofunction: Secondary | ICD-10-CM | POA: Diagnosis not present

## 2022-07-06 DIAGNOSIS — I10 Essential (primary) hypertension: Secondary | ICD-10-CM | POA: Diagnosis not present

## 2022-07-06 DIAGNOSIS — Z7901 Long term (current) use of anticoagulants: Secondary | ICD-10-CM | POA: Diagnosis not present

## 2022-07-06 DIAGNOSIS — C642 Malignant neoplasm of left kidney, except renal pelvis: Secondary | ICD-10-CM | POA: Diagnosis not present

## 2022-07-06 DIAGNOSIS — E785 Hyperlipidemia, unspecified: Secondary | ICD-10-CM | POA: Diagnosis not present

## 2022-07-06 DIAGNOSIS — Z7984 Long term (current) use of oral hypoglycemic drugs: Secondary | ICD-10-CM | POA: Diagnosis not present

## 2022-07-06 DIAGNOSIS — Z5181 Encounter for therapeutic drug level monitoring: Secondary | ICD-10-CM | POA: Diagnosis not present

## 2022-07-06 DIAGNOSIS — E1169 Type 2 diabetes mellitus with other specified complication: Secondary | ICD-10-CM | POA: Diagnosis not present

## 2022-07-06 DIAGNOSIS — Z905 Acquired absence of kidney: Secondary | ICD-10-CM | POA: Diagnosis not present

## 2022-07-06 DIAGNOSIS — Z86711 Personal history of pulmonary embolism: Secondary | ICD-10-CM | POA: Diagnosis not present

## 2022-07-20 DIAGNOSIS — Z5112 Encounter for antineoplastic immunotherapy: Secondary | ICD-10-CM | POA: Diagnosis not present

## 2022-07-20 DIAGNOSIS — Z79899 Other long term (current) drug therapy: Secondary | ICD-10-CM | POA: Diagnosis not present

## 2022-07-20 DIAGNOSIS — C642 Malignant neoplasm of left kidney, except renal pelvis: Secondary | ICD-10-CM | POA: Diagnosis not present

## 2022-08-03 DIAGNOSIS — I10 Essential (primary) hypertension: Secondary | ICD-10-CM | POA: Diagnosis not present

## 2022-08-03 DIAGNOSIS — Z79899 Other long term (current) drug therapy: Secondary | ICD-10-CM | POA: Diagnosis not present

## 2022-08-03 DIAGNOSIS — C642 Malignant neoplasm of left kidney, except renal pelvis: Secondary | ICD-10-CM | POA: Diagnosis not present

## 2022-08-03 DIAGNOSIS — Z5112 Encounter for antineoplastic immunotherapy: Secondary | ICD-10-CM | POA: Diagnosis not present

## 2022-08-03 DIAGNOSIS — E119 Type 2 diabetes mellitus without complications: Secondary | ICD-10-CM | POA: Diagnosis not present

## 2022-08-16 DIAGNOSIS — C7839 Secondary malignant neoplasm of other respiratory organs: Secondary | ICD-10-CM | POA: Diagnosis not present

## 2022-08-16 DIAGNOSIS — K5100 Ulcerative (chronic) pancolitis without complications: Secondary | ICD-10-CM | POA: Diagnosis not present

## 2022-08-16 DIAGNOSIS — D84821 Immunodeficiency due to drugs: Secondary | ICD-10-CM | POA: Diagnosis not present

## 2022-08-16 DIAGNOSIS — Z0001 Encounter for general adult medical examination with abnormal findings: Secondary | ICD-10-CM | POA: Diagnosis not present

## 2022-08-16 DIAGNOSIS — E2740 Unspecified adrenocortical insufficiency: Secondary | ICD-10-CM | POA: Diagnosis not present

## 2022-08-16 DIAGNOSIS — C642 Malignant neoplasm of left kidney, except renal pelvis: Secondary | ICD-10-CM | POA: Diagnosis not present

## 2022-08-16 DIAGNOSIS — Z79899 Other long term (current) drug therapy: Secondary | ICD-10-CM | POA: Diagnosis not present

## 2022-08-16 DIAGNOSIS — E7800 Pure hypercholesterolemia, unspecified: Secondary | ICD-10-CM | POA: Diagnosis not present

## 2022-08-16 DIAGNOSIS — I10 Essential (primary) hypertension: Secondary | ICD-10-CM | POA: Diagnosis not present

## 2022-08-16 DIAGNOSIS — E119 Type 2 diabetes mellitus without complications: Secondary | ICD-10-CM | POA: Diagnosis not present

## 2022-08-16 DIAGNOSIS — D6859 Other primary thrombophilia: Secondary | ICD-10-CM | POA: Diagnosis not present

## 2022-08-17 DIAGNOSIS — Z79899 Other long term (current) drug therapy: Secondary | ICD-10-CM | POA: Diagnosis not present

## 2022-08-17 DIAGNOSIS — C642 Malignant neoplasm of left kidney, except renal pelvis: Secondary | ICD-10-CM | POA: Diagnosis not present

## 2022-08-17 DIAGNOSIS — Z5112 Encounter for antineoplastic immunotherapy: Secondary | ICD-10-CM | POA: Diagnosis not present

## 2022-08-31 DIAGNOSIS — I10 Essential (primary) hypertension: Secondary | ICD-10-CM | POA: Diagnosis not present

## 2022-08-31 DIAGNOSIS — C7970 Secondary malignant neoplasm of unspecified adrenal gland: Secondary | ICD-10-CM | POA: Diagnosis not present

## 2022-08-31 DIAGNOSIS — Z5112 Encounter for antineoplastic immunotherapy: Secondary | ICD-10-CM | POA: Diagnosis not present

## 2022-08-31 DIAGNOSIS — C7800 Secondary malignant neoplasm of unspecified lung: Secondary | ICD-10-CM | POA: Diagnosis not present

## 2022-08-31 DIAGNOSIS — C642 Malignant neoplasm of left kidney, except renal pelvis: Secondary | ICD-10-CM | POA: Diagnosis not present

## 2022-08-31 DIAGNOSIS — Z79899 Other long term (current) drug therapy: Secondary | ICD-10-CM | POA: Diagnosis not present

## 2022-09-14 DIAGNOSIS — Z5112 Encounter for antineoplastic immunotherapy: Secondary | ICD-10-CM | POA: Diagnosis not present

## 2022-09-14 DIAGNOSIS — Z79899 Other long term (current) drug therapy: Secondary | ICD-10-CM | POA: Diagnosis not present

## 2022-09-14 DIAGNOSIS — C642 Malignant neoplasm of left kidney, except renal pelvis: Secondary | ICD-10-CM | POA: Diagnosis not present

## 2022-09-26 DIAGNOSIS — C642 Malignant neoplasm of left kidney, except renal pelvis: Secondary | ICD-10-CM | POA: Diagnosis not present

## 2022-09-26 DIAGNOSIS — K5730 Diverticulosis of large intestine without perforation or abscess without bleeding: Secondary | ICD-10-CM | POA: Diagnosis not present

## 2022-09-26 DIAGNOSIS — N3289 Other specified disorders of bladder: Secondary | ICD-10-CM | POA: Diagnosis not present

## 2022-09-28 DIAGNOSIS — C7800 Secondary malignant neoplasm of unspecified lung: Secondary | ICD-10-CM | POA: Diagnosis not present

## 2022-09-28 DIAGNOSIS — Z5112 Encounter for antineoplastic immunotherapy: Secondary | ICD-10-CM | POA: Diagnosis not present

## 2022-09-28 DIAGNOSIS — I129 Hypertensive chronic kidney disease with stage 1 through stage 4 chronic kidney disease, or unspecified chronic kidney disease: Secondary | ICD-10-CM | POA: Diagnosis not present

## 2022-09-28 DIAGNOSIS — Z79899 Other long term (current) drug therapy: Secondary | ICD-10-CM | POA: Diagnosis not present

## 2022-09-28 DIAGNOSIS — K5090 Crohn's disease, unspecified, without complications: Secondary | ICD-10-CM | POA: Diagnosis not present

## 2022-09-28 DIAGNOSIS — N189 Chronic kidney disease, unspecified: Secondary | ICD-10-CM | POA: Diagnosis not present

## 2022-09-28 DIAGNOSIS — C7970 Secondary malignant neoplasm of unspecified adrenal gland: Secondary | ICD-10-CM | POA: Diagnosis not present

## 2022-09-28 DIAGNOSIS — C642 Malignant neoplasm of left kidney, except renal pelvis: Secondary | ICD-10-CM | POA: Diagnosis not present

## 2022-10-03 DIAGNOSIS — E271 Primary adrenocortical insufficiency: Secondary | ICD-10-CM | POA: Diagnosis not present

## 2022-10-12 DIAGNOSIS — Z79899 Other long term (current) drug therapy: Secondary | ICD-10-CM | POA: Diagnosis not present

## 2022-10-12 DIAGNOSIS — Z5112 Encounter for antineoplastic immunotherapy: Secondary | ICD-10-CM | POA: Diagnosis not present

## 2022-10-12 DIAGNOSIS — C642 Malignant neoplasm of left kidney, except renal pelvis: Secondary | ICD-10-CM | POA: Diagnosis not present

## 2022-11-09 DIAGNOSIS — C642 Malignant neoplasm of left kidney, except renal pelvis: Secondary | ICD-10-CM | POA: Diagnosis not present

## 2022-11-09 DIAGNOSIS — C7800 Secondary malignant neoplasm of unspecified lung: Secondary | ICD-10-CM | POA: Diagnosis not present

## 2022-11-09 DIAGNOSIS — C7970 Secondary malignant neoplasm of unspecified adrenal gland: Secondary | ICD-10-CM | POA: Diagnosis not present

## 2022-11-09 DIAGNOSIS — K5090 Crohn's disease, unspecified, without complications: Secondary | ICD-10-CM | POA: Diagnosis not present

## 2022-11-09 DIAGNOSIS — I10 Essential (primary) hypertension: Secondary | ICD-10-CM | POA: Diagnosis not present

## 2022-11-09 DIAGNOSIS — Z5112 Encounter for antineoplastic immunotherapy: Secondary | ICD-10-CM | POA: Diagnosis not present

## 2022-11-09 DIAGNOSIS — Z79899 Other long term (current) drug therapy: Secondary | ICD-10-CM | POA: Diagnosis not present

## 2022-12-07 DIAGNOSIS — C642 Malignant neoplasm of left kidney, except renal pelvis: Secondary | ICD-10-CM | POA: Diagnosis not present

## 2022-12-07 DIAGNOSIS — Z79899 Other long term (current) drug therapy: Secondary | ICD-10-CM | POA: Diagnosis not present

## 2022-12-07 DIAGNOSIS — Z5112 Encounter for antineoplastic immunotherapy: Secondary | ICD-10-CM | POA: Diagnosis not present

## 2022-12-08 DIAGNOSIS — E119 Type 2 diabetes mellitus without complications: Secondary | ICD-10-CM | POA: Diagnosis not present

## 2022-12-08 DIAGNOSIS — H5213 Myopia, bilateral: Secondary | ICD-10-CM | POA: Diagnosis not present

## 2022-12-08 DIAGNOSIS — H524 Presbyopia: Secondary | ICD-10-CM | POA: Diagnosis not present

## 2022-12-08 DIAGNOSIS — H52223 Regular astigmatism, bilateral: Secondary | ICD-10-CM | POA: Diagnosis not present

## 2022-12-08 DIAGNOSIS — H25813 Combined forms of age-related cataract, bilateral: Secondary | ICD-10-CM | POA: Diagnosis not present

## 2023-01-04 DIAGNOSIS — C642 Malignant neoplasm of left kidney, except renal pelvis: Secondary | ICD-10-CM | POA: Diagnosis not present

## 2023-01-04 DIAGNOSIS — Z5112 Encounter for antineoplastic immunotherapy: Secondary | ICD-10-CM | POA: Diagnosis not present

## 2023-01-04 DIAGNOSIS — Z79899 Other long term (current) drug therapy: Secondary | ICD-10-CM | POA: Diagnosis not present

## 2023-02-01 DIAGNOSIS — Z79899 Other long term (current) drug therapy: Secondary | ICD-10-CM | POA: Diagnosis not present

## 2023-02-01 DIAGNOSIS — C642 Malignant neoplasm of left kidney, except renal pelvis: Secondary | ICD-10-CM | POA: Diagnosis not present

## 2023-02-01 DIAGNOSIS — Z5112 Encounter for antineoplastic immunotherapy: Secondary | ICD-10-CM | POA: Diagnosis not present

## 2023-02-15 DIAGNOSIS — K7689 Other specified diseases of liver: Secondary | ICD-10-CM | POA: Diagnosis not present

## 2023-02-15 DIAGNOSIS — R918 Other nonspecific abnormal finding of lung field: Secondary | ICD-10-CM | POA: Diagnosis not present

## 2023-02-15 DIAGNOSIS — K760 Fatty (change of) liver, not elsewhere classified: Secondary | ICD-10-CM | POA: Diagnosis not present

## 2023-02-15 DIAGNOSIS — N281 Cyst of kidney, acquired: Secondary | ICD-10-CM | POA: Diagnosis not present

## 2023-02-15 DIAGNOSIS — C642 Malignant neoplasm of left kidney, except renal pelvis: Secondary | ICD-10-CM | POA: Diagnosis not present

## 2023-02-15 DIAGNOSIS — I2510 Atherosclerotic heart disease of native coronary artery without angina pectoris: Secondary | ICD-10-CM | POA: Diagnosis not present

## 2023-02-19 DIAGNOSIS — Z23 Encounter for immunization: Secondary | ICD-10-CM | POA: Diagnosis not present

## 2023-02-19 DIAGNOSIS — Z79899 Other long term (current) drug therapy: Secondary | ICD-10-CM | POA: Diagnosis not present

## 2023-02-19 DIAGNOSIS — R350 Frequency of micturition: Secondary | ICD-10-CM | POA: Diagnosis not present

## 2023-02-19 DIAGNOSIS — E119 Type 2 diabetes mellitus without complications: Secondary | ICD-10-CM | POA: Diagnosis not present

## 2023-03-01 DIAGNOSIS — Z79899 Other long term (current) drug therapy: Secondary | ICD-10-CM | POA: Diagnosis not present

## 2023-03-01 DIAGNOSIS — Z5112 Encounter for antineoplastic immunotherapy: Secondary | ICD-10-CM | POA: Diagnosis not present

## 2023-03-01 DIAGNOSIS — C642 Malignant neoplasm of left kidney, except renal pelvis: Secondary | ICD-10-CM | POA: Diagnosis not present

## 2023-03-11 IMAGING — CT CT RENAL STONE PROTOCOL
2 of 4 series · 15 of 46 positions shown, 17 images · non-contrast
Comparison: CT abdomen pelvis 11/12/2017

CLINICAL DATA: Hematuria X 2 weeks. History of renal cyst. Diabetes
and hypertension.

EXAM:
CT ABDOMEN AND PELVIS WITHOUT CONTRAST
TECHNIQUE: Multidetector CT imaging of the abdomen and pelvis was performed
following the standard protocol without IV contrast.

[Series 2: stone full · axial · 0.87mm/px · z∈[+648,+1083]mm · 12 of 99 slices shown, 14 images]
[im 8/99  soft-tissue]
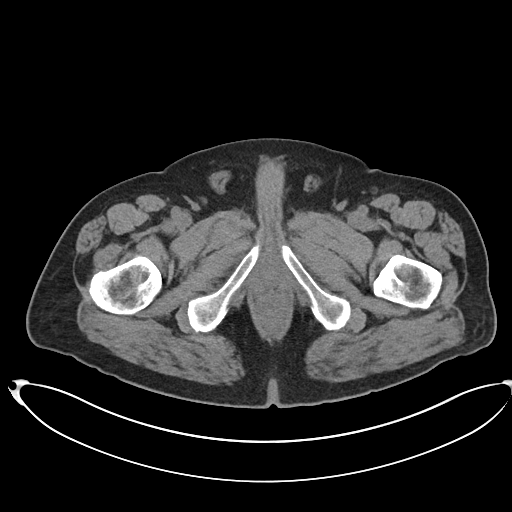
[im 8/99  bone]
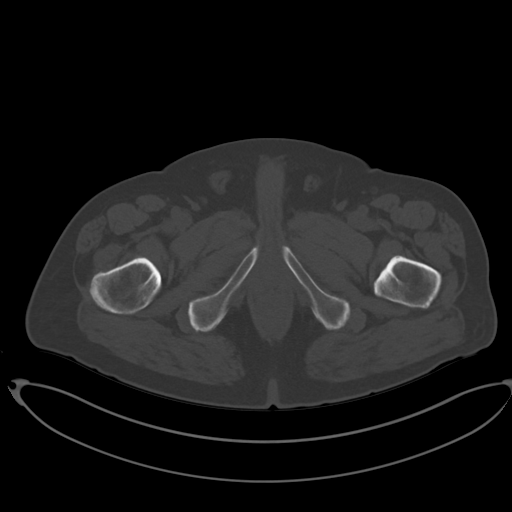
[im 16/99  soft-tissue]
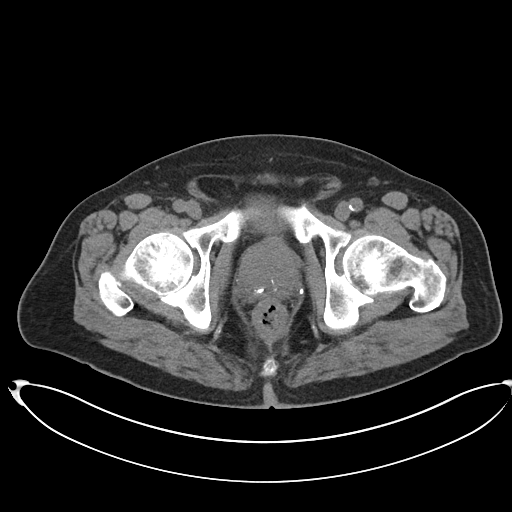
[im 24/99  soft-tissue]
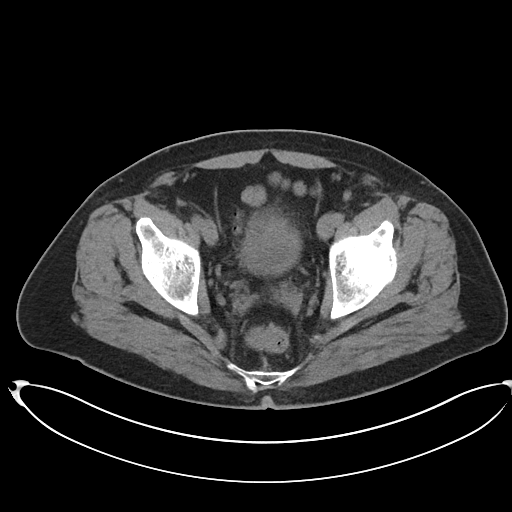
[im 32/99  soft-tissue]
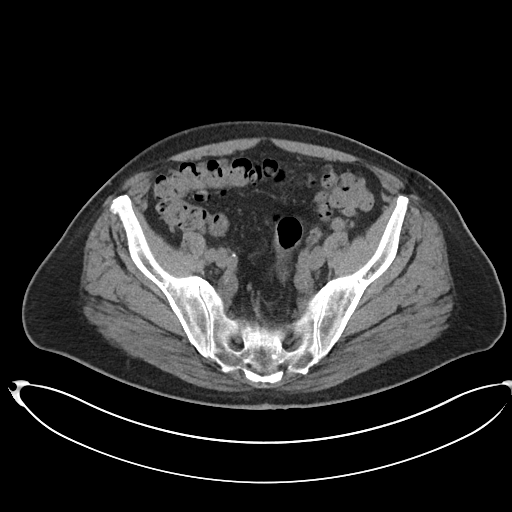
[im 40/99  soft-tissue]
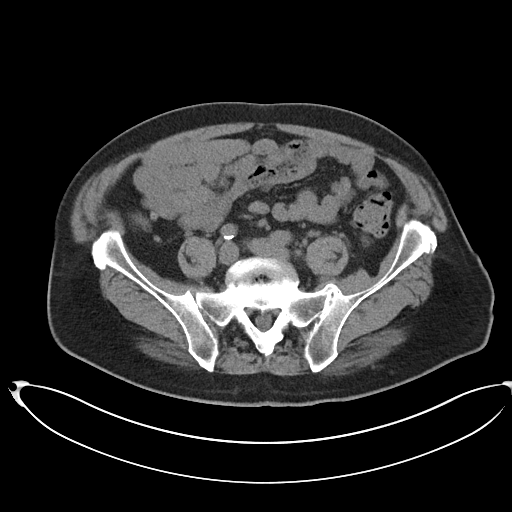
[im 48/99  soft-tissue]
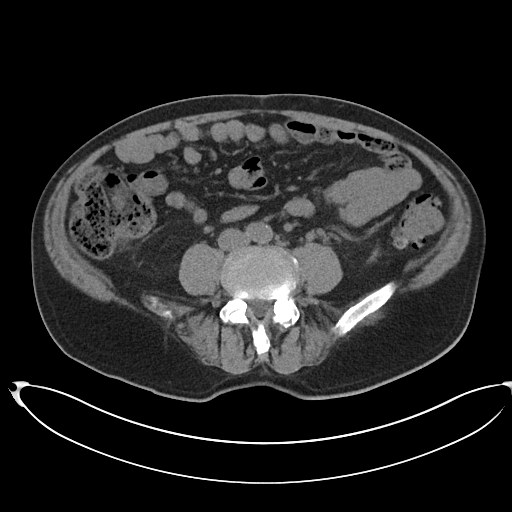
[im 55/99  soft-tissue]
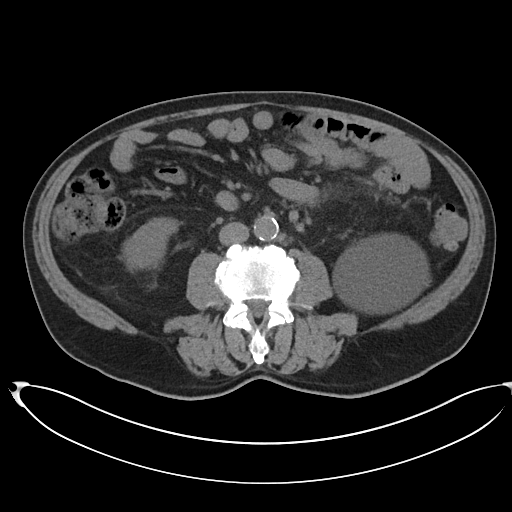
[im 63/99  soft-tissue]
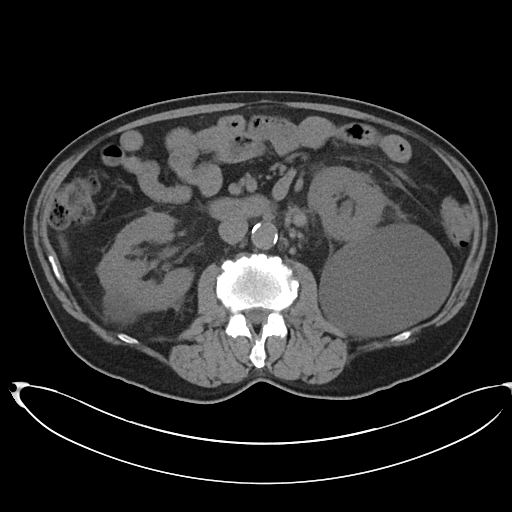
[im 71/99  soft-tissue]
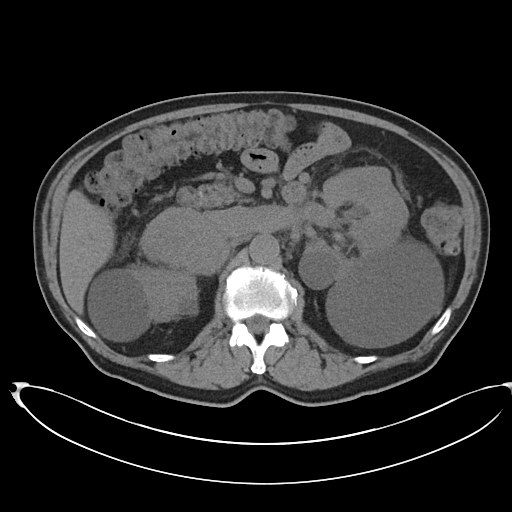
[im 71/99  bone]
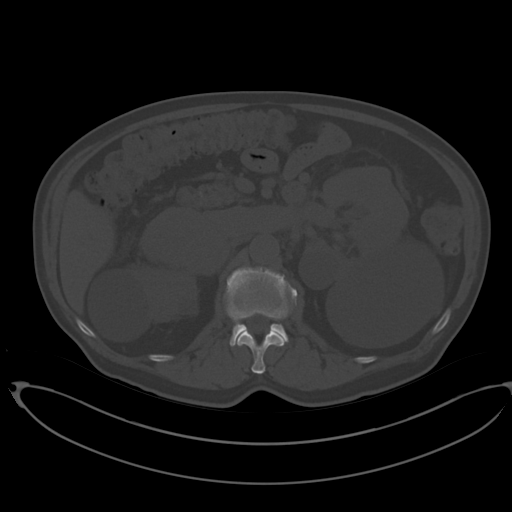
[im 79/99  soft-tissue]
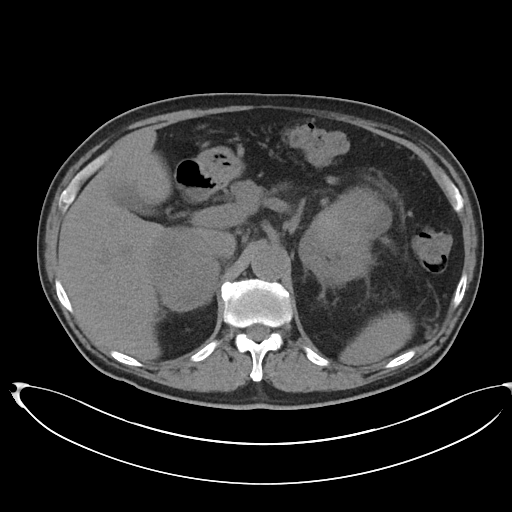
[im 87/99  soft-tissue]
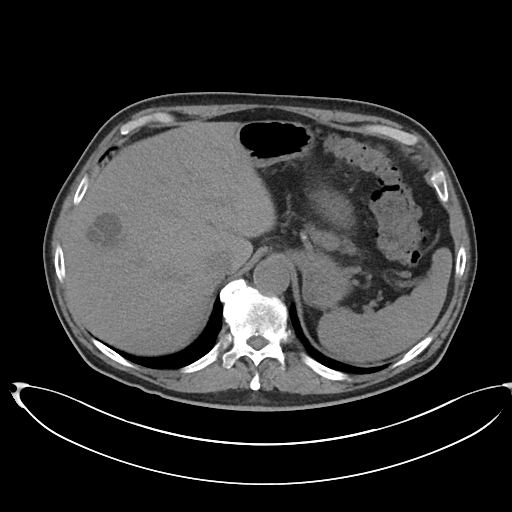
[im 95/99  soft-tissue]
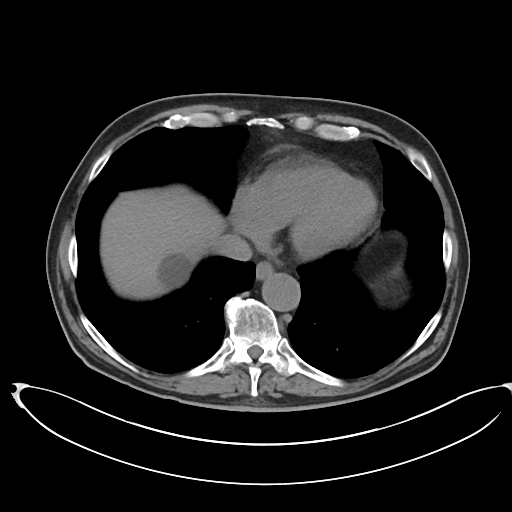

[Series 5: coronal · coronal · 0.85mm/px · 3 of 101 slices shown]
[im 34/101  soft-tissue]
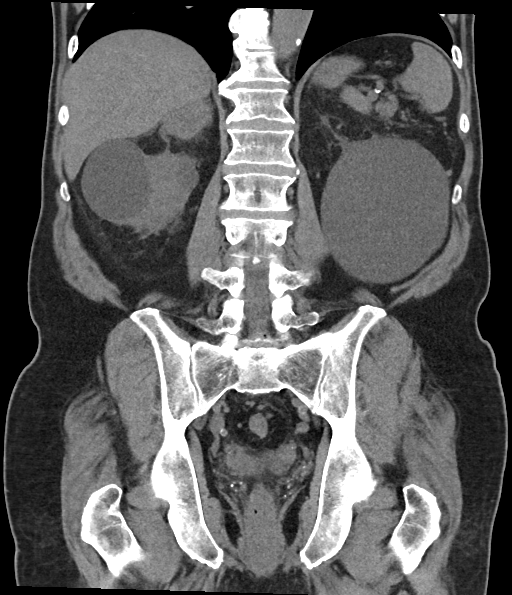
[im 45/101  soft-tissue]
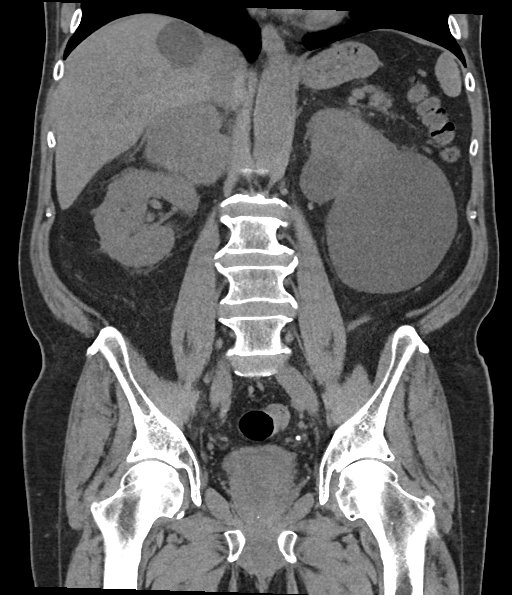
[im 56/101  soft-tissue]
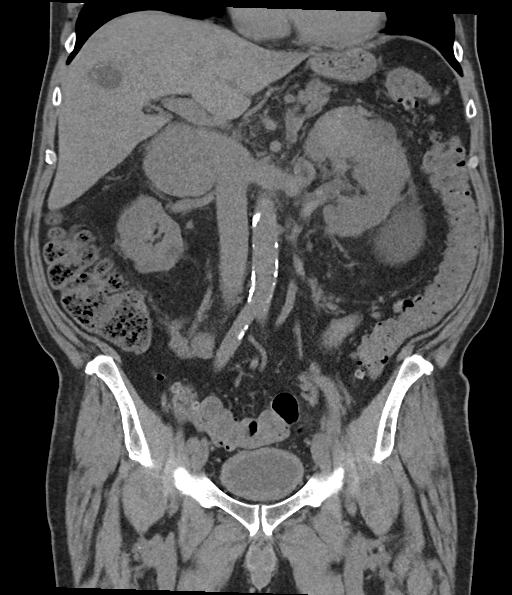

[15 of 46 positions shown; findings below may reference images not displayed]

FINDINGS: Lower chest: No acute abnormality.

Hepatobiliary: Similar-appearing right hepatic lobe fluid density
lesions with a again noted septated 3.2 cm cyst ([DATE]). No
gallstones, gallbladder wall thickening, or pericholecystic fluid.
No biliary dilatation.

Pancreas: No focal lesion. Normal pancreatic contour. No surrounding
inflammatory changes. No main pancreatic ductal dilatation.

Spleen: Normal in size without focal abnormality.

Adrenals/Urinary Tract:

The right adrenal gland is not visualized. There is a heterogeneous
7.9 x 6.3 x 6.6 cm lesion in the expected region of the right
adrenal gland that appears to be inseparable from the inferior vena
cava and appears to abut the right hepatic lobe and caudate lobe. No
left adrenal nodularity.

Interval development of perinephric fat stranding on the left.
Interval development of an ill-defined, heterogeneous, 6 x 4.5 x
cm interpolar left renal lesion. Interval increase in size of a
cm left renal cyst (from 9 cm). Several other fluid density lesions
within the kidneys are also slightly increased in size. No
nephrolithiasis, no hydronephrosis, and no contour-deforming renal
mass. No ureterolithiasis or hydroureter.

The urinary bladder is unremarkable.

Stomach/Bowel: Stomach is within normal limits. No evidence of bowel
wall thickening or dilatation. Diffuse colonic diverticulosis.
Appendix appears normal.

Vascular/Lymphatic: Diffusely enlarged left renal vein measuring up
to 2.4 cm. No abdominal aorta or iliac aneurysm. At least moderate
to severe atherosclerotic plaque of the aorta and its branches. No
abdominal, pelvic, or inguinal lymphadenopathy.

Reproductive: The prostate is enlarged measuring up to 5.2 cm.

Other: No intraperitoneal free fluid. No intraperitoneal free gas.
No organized fluid collection.

Musculoskeletal:

No abdominal wall hernia or abnormality.

No suspicious lytic or blastic osseous lesions. No acute displaced
fracture. Multilevel degenerative changes of the spine.
IMPRESSION: 1. Interval development of a 6 x 4.5 x 6.3 cm interpolar left renal
mass as well as a 7.9 x 6.3 x 6.6 cm lesion in the expected region
of the right adrenal gland that appears to be inseparable
from/replaces the inferior vena cava. Enlarged left renal vein
measuring up to 2.4 cm suggestive of malignant thrombus extension.
Findings consistent with metastatic malignancy. Recommend further
evaluation with MRI renal protocol.
2. Interval increase in size of several cystic lesions within the
kidneys with the largest now measuring 11.7 cm on the left.
3. Stable right hepatic cystic lesions.
4. Diffuse colonic diverticulosis with no acute diverticulitis.
5. Prostatomegaly.

These results will be called to the ordering clinician or
representative by the Radiologist Assistant, and communication
documented in the PACS or [REDACTED].

## 2023-03-30 DIAGNOSIS — C642 Malignant neoplasm of left kidney, except renal pelvis: Secondary | ICD-10-CM | POA: Diagnosis not present

## 2023-03-30 DIAGNOSIS — Z5112 Encounter for antineoplastic immunotherapy: Secondary | ICD-10-CM | POA: Diagnosis not present

## 2023-03-30 DIAGNOSIS — Z79899 Other long term (current) drug therapy: Secondary | ICD-10-CM | POA: Diagnosis not present

## 2023-04-26 DIAGNOSIS — Z5112 Encounter for antineoplastic immunotherapy: Secondary | ICD-10-CM | POA: Diagnosis not present

## 2023-04-26 DIAGNOSIS — C642 Malignant neoplasm of left kidney, except renal pelvis: Secondary | ICD-10-CM | POA: Diagnosis not present

## 2023-04-26 DIAGNOSIS — Z79899 Other long term (current) drug therapy: Secondary | ICD-10-CM | POA: Diagnosis not present

## 2023-05-24 DIAGNOSIS — Z79899 Other long term (current) drug therapy: Secondary | ICD-10-CM | POA: Diagnosis not present

## 2023-05-24 DIAGNOSIS — Z5112 Encounter for antineoplastic immunotherapy: Secondary | ICD-10-CM | POA: Diagnosis not present

## 2023-05-24 DIAGNOSIS — C642 Malignant neoplasm of left kidney, except renal pelvis: Secondary | ICD-10-CM | POA: Diagnosis not present

## 2023-05-30 DIAGNOSIS — Z86718 Personal history of other venous thrombosis and embolism: Secondary | ICD-10-CM | POA: Diagnosis not present

## 2023-05-30 DIAGNOSIS — E2740 Unspecified adrenocortical insufficiency: Secondary | ICD-10-CM | POA: Diagnosis not present

## 2023-05-30 DIAGNOSIS — C642 Malignant neoplasm of left kidney, except renal pelvis: Secondary | ICD-10-CM | POA: Diagnosis not present

## 2023-05-30 DIAGNOSIS — K5100 Ulcerative (chronic) pancolitis without complications: Secondary | ICD-10-CM | POA: Diagnosis not present

## 2023-06-25 DIAGNOSIS — Z5112 Encounter for antineoplastic immunotherapy: Secondary | ICD-10-CM | POA: Diagnosis not present

## 2023-06-25 DIAGNOSIS — C642 Malignant neoplasm of left kidney, except renal pelvis: Secondary | ICD-10-CM | POA: Diagnosis not present

## 2023-06-25 DIAGNOSIS — Z79899 Other long term (current) drug therapy: Secondary | ICD-10-CM | POA: Diagnosis not present

## 2023-07-13 DIAGNOSIS — K5730 Diverticulosis of large intestine without perforation or abscess without bleeding: Secondary | ICD-10-CM | POA: Diagnosis not present

## 2023-07-13 DIAGNOSIS — C642 Malignant neoplasm of left kidney, except renal pelvis: Secondary | ICD-10-CM | POA: Diagnosis not present

## 2023-07-13 DIAGNOSIS — K7689 Other specified diseases of liver: Secondary | ICD-10-CM | POA: Diagnosis not present

## 2023-07-13 DIAGNOSIS — I2510 Atherosclerotic heart disease of native coronary artery without angina pectoris: Secondary | ICD-10-CM | POA: Diagnosis not present

## 2023-07-13 DIAGNOSIS — N281 Cyst of kidney, acquired: Secondary | ICD-10-CM | POA: Diagnosis not present

## 2023-07-31 DIAGNOSIS — Z79899 Other long term (current) drug therapy: Secondary | ICD-10-CM | POA: Diagnosis not present

## 2023-07-31 DIAGNOSIS — C642 Malignant neoplasm of left kidney, except renal pelvis: Secondary | ICD-10-CM | POA: Diagnosis not present

## 2023-07-31 DIAGNOSIS — I10 Essential (primary) hypertension: Secondary | ICD-10-CM | POA: Diagnosis not present

## 2023-07-31 DIAGNOSIS — Z7969 Long term (current) use of other immunomodulators and immunosuppressants: Secondary | ICD-10-CM | POA: Diagnosis not present

## 2023-08-22 DIAGNOSIS — Z23 Encounter for immunization: Secondary | ICD-10-CM | POA: Diagnosis not present

## 2023-08-22 DIAGNOSIS — Z79899 Other long term (current) drug therapy: Secondary | ICD-10-CM | POA: Diagnosis not present

## 2023-08-22 DIAGNOSIS — N1831 Chronic kidney disease, stage 3a: Secondary | ICD-10-CM | POA: Diagnosis not present

## 2023-08-22 DIAGNOSIS — I10 Essential (primary) hypertension: Secondary | ICD-10-CM | POA: Diagnosis not present

## 2023-08-22 DIAGNOSIS — K5100 Ulcerative (chronic) pancolitis without complications: Secondary | ICD-10-CM | POA: Diagnosis not present

## 2023-08-22 DIAGNOSIS — E119 Type 2 diabetes mellitus without complications: Secondary | ICD-10-CM | POA: Diagnosis not present

## 2023-08-22 DIAGNOSIS — C7839 Secondary malignant neoplasm of other respiratory organs: Secondary | ICD-10-CM | POA: Diagnosis not present

## 2023-08-22 DIAGNOSIS — C642 Malignant neoplasm of left kidney, except renal pelvis: Secondary | ICD-10-CM | POA: Diagnosis not present

## 2023-08-22 DIAGNOSIS — E7800 Pure hypercholesterolemia, unspecified: Secondary | ICD-10-CM | POA: Diagnosis not present

## 2023-08-22 DIAGNOSIS — Z0000 Encounter for general adult medical examination without abnormal findings: Secondary | ICD-10-CM | POA: Diagnosis not present

## 2023-08-22 DIAGNOSIS — E2740 Unspecified adrenocortical insufficiency: Secondary | ICD-10-CM | POA: Diagnosis not present

## 2023-09-07 ENCOUNTER — Other Ambulatory Visit: Payer: Self-pay | Admitting: Family Medicine

## 2023-09-07 DIAGNOSIS — R410 Disorientation, unspecified: Secondary | ICD-10-CM

## 2023-09-07 DIAGNOSIS — C642 Malignant neoplasm of left kidney, except renal pelvis: Secondary | ICD-10-CM

## 2023-09-30 ENCOUNTER — Ambulatory Visit
Admission: RE | Admit: 2023-09-30 | Discharge: 2023-09-30 | Disposition: A | Discharge status: Home / Self Care | Source: Ambulatory Visit | Attending: Family Medicine | Admitting: Family Medicine

## 2023-09-30 DIAGNOSIS — R410 Disorientation, unspecified: Secondary | ICD-10-CM

## 2023-09-30 DIAGNOSIS — C642 Malignant neoplasm of left kidney, except renal pelvis: Secondary | ICD-10-CM | POA: Diagnosis not present

## 2023-09-30 DIAGNOSIS — I6782 Cerebral ischemia: Secondary | ICD-10-CM | POA: Diagnosis not present

## 2023-10-03 DIAGNOSIS — E785 Hyperlipidemia, unspecified: Secondary | ICD-10-CM | POA: Diagnosis not present

## 2023-10-03 DIAGNOSIS — E271 Primary adrenocortical insufficiency: Secondary | ICD-10-CM | POA: Diagnosis not present

## 2023-10-03 DIAGNOSIS — E1122 Type 2 diabetes mellitus with diabetic chronic kidney disease: Secondary | ICD-10-CM | POA: Diagnosis not present

## 2023-10-03 DIAGNOSIS — I152 Hypertension secondary to endocrine disorders: Secondary | ICD-10-CM | POA: Diagnosis not present

## 2023-10-03 DIAGNOSIS — E1159 Type 2 diabetes mellitus with other circulatory complications: Secondary | ICD-10-CM | POA: Diagnosis not present

## 2023-10-03 DIAGNOSIS — N1832 Chronic kidney disease, stage 3b: Secondary | ICD-10-CM | POA: Diagnosis not present

## 2023-10-03 DIAGNOSIS — E1169 Type 2 diabetes mellitus with other specified complication: Secondary | ICD-10-CM | POA: Diagnosis not present

## 2023-11-09 DIAGNOSIS — R918 Other nonspecific abnormal finding of lung field: Secondary | ICD-10-CM | POA: Diagnosis not present

## 2023-11-09 DIAGNOSIS — C642 Malignant neoplasm of left kidney, except renal pelvis: Secondary | ICD-10-CM | POA: Diagnosis not present

## 2023-11-09 DIAGNOSIS — Z905 Acquired absence of kidney: Secondary | ICD-10-CM | POA: Diagnosis not present

## 2023-11-23 DIAGNOSIS — N189 Chronic kidney disease, unspecified: Secondary | ICD-10-CM | POA: Diagnosis not present

## 2023-11-23 DIAGNOSIS — E119 Type 2 diabetes mellitus without complications: Secondary | ICD-10-CM | POA: Diagnosis not present

## 2023-12-11 DIAGNOSIS — D649 Anemia, unspecified: Secondary | ICD-10-CM | POA: Diagnosis not present

## 2023-12-11 DIAGNOSIS — C642 Malignant neoplasm of left kidney, except renal pelvis: Secondary | ICD-10-CM | POA: Diagnosis not present

## 2023-12-19 DIAGNOSIS — H524 Presbyopia: Secondary | ICD-10-CM | POA: Diagnosis not present

## 2023-12-19 DIAGNOSIS — H25813 Combined forms of age-related cataract, bilateral: Secondary | ICD-10-CM | POA: Diagnosis not present

## 2023-12-19 DIAGNOSIS — H52223 Regular astigmatism, bilateral: Secondary | ICD-10-CM | POA: Diagnosis not present

## 2023-12-19 DIAGNOSIS — E119 Type 2 diabetes mellitus without complications: Secondary | ICD-10-CM | POA: Diagnosis not present

## 2023-12-19 DIAGNOSIS — H5213 Myopia, bilateral: Secondary | ICD-10-CM | POA: Diagnosis not present

## 2024-01-02 DIAGNOSIS — E119 Type 2 diabetes mellitus without complications: Secondary | ICD-10-CM | POA: Diagnosis not present

## 2024-01-03 DIAGNOSIS — E119 Type 2 diabetes mellitus without complications: Secondary | ICD-10-CM | POA: Diagnosis not present

## 2024-01-03 DIAGNOSIS — R21 Rash and other nonspecific skin eruption: Secondary | ICD-10-CM | POA: Diagnosis not present

## 2024-01-18 DIAGNOSIS — H25813 Combined forms of age-related cataract, bilateral: Secondary | ICD-10-CM | POA: Diagnosis not present

## 2024-01-18 DIAGNOSIS — E119 Type 2 diabetes mellitus without complications: Secondary | ICD-10-CM | POA: Diagnosis not present

## 2024-01-18 DIAGNOSIS — H43812 Vitreous degeneration, left eye: Secondary | ICD-10-CM | POA: Diagnosis not present

## 2024-01-18 DIAGNOSIS — H35371 Puckering of macula, right eye: Secondary | ICD-10-CM | POA: Diagnosis not present

## 2024-01-24 DIAGNOSIS — E119 Type 2 diabetes mellitus without complications: Secondary | ICD-10-CM | POA: Diagnosis not present

## 2024-01-24 DIAGNOSIS — E1169 Type 2 diabetes mellitus with other specified complication: Secondary | ICD-10-CM | POA: Diagnosis not present

## 2024-01-24 DIAGNOSIS — R21 Rash and other nonspecific skin eruption: Secondary | ICD-10-CM | POA: Diagnosis not present

## 2024-01-29 DIAGNOSIS — K5100 Ulcerative (chronic) pancolitis without complications: Secondary | ICD-10-CM | POA: Diagnosis not present

## 2024-01-29 DIAGNOSIS — C642 Malignant neoplasm of left kidney, except renal pelvis: Secondary | ICD-10-CM | POA: Diagnosis not present

## 2024-01-29 DIAGNOSIS — Z86718 Personal history of other venous thrombosis and embolism: Secondary | ICD-10-CM | POA: Diagnosis not present

## 2024-01-29 DIAGNOSIS — E2740 Unspecified adrenocortical insufficiency: Secondary | ICD-10-CM | POA: Diagnosis not present

## 2024-01-30 DIAGNOSIS — L400 Psoriasis vulgaris: Secondary | ICD-10-CM | POA: Diagnosis not present

## 2024-02-27 DIAGNOSIS — L400 Psoriasis vulgaris: Secondary | ICD-10-CM | POA: Diagnosis not present

## 2024-03-10 NOTE — Progress Notes (Addendum)
 Shane Snyder                                          MRN: [id number]   04/08/2024   The [facility] Quality Team Specialist reviewed this patient medical record for the purposes of chart review for care gap closure. The following were reviewed: chart review for care gap closure-kidney health evaluation for diabetes:uACR.    [facility] Quality Team
